# Patient Record
Sex: Male | Born: 1960
Health system: Southern US, Community
[De-identification: ages and names within clinical notes are randomized; demographics above are authoritative.]

## PROBLEM LIST (undated history)

## (undated) DIAGNOSIS — J9383 Other pneumothorax: Secondary | ICD-10-CM

## (undated) DIAGNOSIS — E785 Hyperlipidemia, unspecified: Secondary | ICD-10-CM

## (undated) DIAGNOSIS — M1990 Unspecified osteoarthritis, unspecified site: Secondary | ICD-10-CM

## (undated) HISTORY — DX: Hyperlipidemia, unspecified: E78.5

## (undated) HISTORY — DX: Unspecified osteoarthritis, unspecified site: M19.90

---

## 2007-12-10 ENCOUNTER — Emergency Department (HOSPITAL_COMMUNITY): Admission: EM | Admit: 2007-12-10 | Discharge: 2007-12-10 | Payer: Self-pay | Admitting: Emergency Medicine

## 2009-12-31 ENCOUNTER — Encounter: Admission: RE | Admit: 2009-12-31 | Discharge: 2009-12-31 | Payer: Self-pay | Admitting: Neurology

## 2010-01-16 ENCOUNTER — Encounter: Admission: RE | Admit: 2010-01-16 | Discharge: 2010-01-16 | Payer: Self-pay | Admitting: Neurology

## 2010-03-20 HISTORY — PX: TOTAL HIP ARTHROPLASTY: SHX124

## 2010-03-26 ENCOUNTER — Inpatient Hospital Stay (HOSPITAL_COMMUNITY): Admission: RE | Admit: 2010-03-26 | Discharge: 2010-03-28 | Payer: Self-pay | Admitting: Orthopedic Surgery

## 2011-01-06 LAB — URINALYSIS, ROUTINE W REFLEX MICROSCOPIC
Hgb urine dipstick: NEGATIVE
Specific Gravity, Urine: 1.015 (ref 1.005–1.030)
Urobilinogen, UA: 0.2 mg/dL (ref 0.0–1.0)

## 2011-01-06 LAB — BASIC METABOLIC PANEL
BUN: 5 mg/dL — ABNORMAL LOW (ref 6–23)
CO2: 23 mEq/L (ref 19–32)
Calcium: 8.6 mg/dL (ref 8.4–10.5)
Calcium: 8.7 mg/dL (ref 8.4–10.5)
Calcium: 9.3 mg/dL (ref 8.4–10.5)
Creatinine, Ser: 0.91 mg/dL (ref 0.4–1.5)
GFR calc Af Amer: >60 mL/min (ref >60)
GFR calc Af Amer: >60 mL/min (ref >60)
GFR calc non Af Amer: >60 mL/min (ref >60)
GFR calc non Af Amer: >60 mL/min (ref >60)
Glucose, Bld: 103 mg/dL — ABNORMAL HIGH (ref 70–99)
Glucose, Bld: 77 mg/dL (ref 70–99)
Potassium: 4 mEq/L (ref 3.5–5.1)
Potassium: 4 mEq/L (ref 3.5–5.1)
Sodium: 137 mEq/L (ref 135–145)
Sodium: 137 mEq/L (ref 135–145)

## 2011-01-06 LAB — CBC
Hemoglobin: 11.9 g/dL — ABNORMAL LOW (ref 13.0–17.0)
MCHC: 33.6 g/dL (ref 30.0–36.0)
MCHC: 34.3 g/dL (ref 30.0–36.0)
MCV: 93.9 fL (ref 78.0–100.0)
Platelets: 233 10*3/uL (ref 150–400)
RBC: 3.71 MIL/uL — ABNORMAL LOW (ref 4.22–5.81)
RBC: 3.85 MIL/uL — ABNORMAL LOW (ref 4.22–5.81)
RDW: 13.5 % (ref 11.5–15.5)
RDW: 13.6 % (ref 11.5–15.5)
RDW: 13.4 % (ref 11.5–15.5)

## 2011-01-06 LAB — TYPE AND SCREEN
ABO/RH(D): B POS
Antibody Screen: NEGATIVE

## 2011-01-06 LAB — ABO/RH: ABO/RH(D): B POS

## 2011-01-06 LAB — DIFFERENTIAL
Basophils Relative: 1 % (ref 0–1)
Eosinophils Absolute: 0.2 10*3/uL (ref 0.0–0.7)
Lymphocytes Relative: 37 % (ref 12–46)
Monocytes Absolute: 0.4 10*3/uL (ref 0.1–1.0)
Monocytes Relative: 11 % (ref 3–12)
Neutro Abs: 1.8 10*3/uL (ref 1.7–7.7)
Neutrophils Relative %: 46 % (ref 43–77)

## 2011-01-06 LAB — URINE MICROSCOPIC-ADD ON

## 2012-09-13 ENCOUNTER — Other Ambulatory Visit: Payer: Self-pay | Admitting: Family Medicine

## 2012-09-13 ENCOUNTER — Ambulatory Visit
Admission: RE | Admit: 2012-09-13 | Discharge: 2012-09-13 | Disposition: A | Discharge status: Home / Self Care | Payer: Medicare Other | Source: Ambulatory Visit | Attending: Family Medicine | Admitting: Family Medicine

## 2012-09-13 DIAGNOSIS — R05 Cough: Secondary | ICD-10-CM

## 2012-09-13 DIAGNOSIS — R0602 Shortness of breath: Secondary | ICD-10-CM

## 2013-02-09 ENCOUNTER — Ambulatory Visit: Payer: Medicare Other | Attending: Orthopedic Surgery | Admitting: Physical Therapy

## 2013-02-09 DIAGNOSIS — IMO0001 Reserved for inherently not codable concepts without codable children: Secondary | ICD-10-CM | POA: Insufficient documentation

## 2013-02-09 DIAGNOSIS — M545 Low back pain, unspecified: Secondary | ICD-10-CM | POA: Insufficient documentation

## 2013-02-15 ENCOUNTER — Ambulatory Visit: Payer: Medicare Other | Admitting: Physical Therapy

## 2013-02-17 ENCOUNTER — Ambulatory Visit: Payer: Medicare Other | Admitting: Physical Therapy

## 2013-02-22 ENCOUNTER — Ambulatory Visit: Payer: Medicare Other | Attending: Orthopedic Surgery | Admitting: Physical Therapy

## 2013-02-22 DIAGNOSIS — M545 Low back pain, unspecified: Secondary | ICD-10-CM | POA: Insufficient documentation

## 2013-02-22 DIAGNOSIS — IMO0001 Reserved for inherently not codable concepts without codable children: Secondary | ICD-10-CM | POA: Insufficient documentation

## 2013-02-28 ENCOUNTER — Encounter: Payer: Self-pay | Admitting: Gastroenterology

## 2013-03-01 ENCOUNTER — Encounter: Payer: Medicare Other | Admitting: Physical Therapy

## 2013-03-01 ENCOUNTER — Ambulatory Visit: Payer: Medicare Other | Admitting: Physical Therapy

## 2013-03-02 ENCOUNTER — Ambulatory Visit: Payer: Medicare Other | Admitting: Physical Therapy

## 2013-03-10 ENCOUNTER — Ambulatory Visit: Payer: Medicare Other | Admitting: Physical Therapy

## 2013-03-15 ENCOUNTER — Ambulatory Visit: Payer: Medicare Other | Admitting: Physical Therapy

## 2013-03-17 ENCOUNTER — Ambulatory Visit: Payer: Medicare Other | Admitting: Physical Therapy

## 2013-03-22 ENCOUNTER — Telehealth: Payer: Self-pay | Admitting: Gastroenterology

## 2013-03-22 ENCOUNTER — Ambulatory Visit (AMBULATORY_SURGERY_CENTER): Payer: Medicare Other | Admitting: *Deleted

## 2013-03-22 VITALS — Ht 69.0 in | Wt 200.2 lb

## 2013-03-22 DIAGNOSIS — Z1211 Encounter for screening for malignant neoplasm of colon: Secondary | ICD-10-CM

## 2013-03-22 MED ORDER — NA SULFATE-K SULFATE-MG SULF 17.5-3.13-1.6 GM/177ML PO SOLN: 1.0000 | Freq: Once | ORAL | Status: DC

## 2013-03-22 NOTE — Progress Notes (Signed)
Denies allergies to eggs or soy products. Denies complications with anesthesia or sedation. 

## 2013-03-22 NOTE — Telephone Encounter (Signed)
Called pt and spoke with him regarding what paperwork he needed faxed. Pt states he left a form on the 3rd floor that needs to be faxed to social security. I did not find any form and advised the pt to check back in the morning since no-one was available on the 3rd floor at this time. Pt understood and will call back in the morning.

## 2013-03-22 NOTE — Telephone Encounter (Signed)
Called pt to verify what he needed faxed, no answer. I was unable to leave a message , but will try again later.

## 2013-03-24 ENCOUNTER — Ambulatory Visit: Payer: Medicare Other | Attending: Orthopedic Surgery | Admitting: Physical Therapy

## 2013-03-24 DIAGNOSIS — M545 Low back pain, unspecified: Secondary | ICD-10-CM | POA: Insufficient documentation

## 2013-03-24 DIAGNOSIS — IMO0001 Reserved for inherently not codable concepts without codable children: Secondary | ICD-10-CM | POA: Insufficient documentation

## 2013-03-28 ENCOUNTER — Telehealth: Payer: Self-pay | Admitting: Gastroenterology

## 2013-03-28 NOTE — Telephone Encounter (Signed)
No answer. Will try pt later 

## 2013-03-28 NOTE — Telephone Encounter (Signed)
Told pt it would be better for him not to donate plasma 1 week before procedure and 1 week after procedure

## 2013-03-29 ENCOUNTER — Ambulatory Visit: Payer: Medicare Other | Admitting: Physical Therapy

## 2013-03-31 ENCOUNTER — Ambulatory Visit: Payer: Medicare Other | Admitting: Physical Therapy

## 2013-04-05 ENCOUNTER — Ambulatory Visit (AMBULATORY_SURGERY_CENTER): Payer: Medicare Other | Admitting: Gastroenterology

## 2013-04-05 ENCOUNTER — Encounter: Payer: Self-pay | Admitting: Gastroenterology

## 2013-04-05 VITALS — BP 164/100 | HR 62 | Temp 97.3°F | Resp 20 | Ht 69.0 in | Wt 200.0 lb

## 2013-04-05 DIAGNOSIS — D126 Benign neoplasm of colon, unspecified: Secondary | ICD-10-CM

## 2013-04-05 DIAGNOSIS — Z1211 Encounter for screening for malignant neoplasm of colon: Secondary | ICD-10-CM

## 2013-04-05 MED ORDER — SODIUM CHLORIDE 0.9 % IV SOLN: 500.0000 mL | INTRAVENOUS | Status: DC

## 2013-04-05 NOTE — Progress Notes (Signed)
Report to pacu rn, vss, bbs=clear 

## 2013-04-05 NOTE — Op Note (Signed)
[facility] [address] [place], [address]   COLONOSCOPY PROCEDURE REPORT  PATIENT: Jeremy Cunningham, Jeremy Cunningham  MR#: [id number] BIRTHDATE: Jul 18, 1961 , 52  yrs. old GENDER: Male ENDOSCOPIST: Louis Meckel, MD REFERRED XB:JYNWG Blount, M.D. PROCEDURE DATE:  04/05/2013 PROCEDURE:   Colonoscopy with snare polypectomy ASA CLASS:   Class II INDICATIONS:average risk screening. MEDICATIONS: MAC sedation, administered by CRNA and Propofol (Diprivan) 230 mg IV  DESCRIPTION OF PROCEDURE:   After the risks benefits and alternatives of the procedure were thoroughly explained, informed consent was obtained.  A digital rectal exam revealed no abnormalities of the rectum.   The LB [id number]  endoscope was introduced through the anus and advanced to the cecum, which was identified by both the appendix and ileocecal valve. No adverse events experienced.   The quality of the prep was excellent using Suprep  The instrument was then slowly withdrawn as the colon was fully examined.      COLON FINDINGS: Two sessile polyps measuring 3 mm in size were found in the sigmoid colon.  A polypectomy was performed with a cold snare.  The resection was complete and the polyp tissue was completely retrieved.   The colon mucosa was otherwise normal. Retroflexed views revealed no abnormalities. The time to cecum=1 minutes 47 seconds.  Withdrawal time=8 minutes 58 seconds.  The scope was withdrawn and the procedure completed. COMPLICATIONS: There were no complications.  ENDOSCOPIC IMPRESSION: 1.   Two sessile polyps measuring 3 mm in size were found in the sigmoid colon; polypectomy was performed with a cold snare 2.   The colon mucosa was otherwise normal  RECOMMENDATIONS: If the polyp(s) removed today are proven to be adenomatous (pre-cancerous) polyps, you will need a repeat colonoscopy in 5 years.  Otherwise you should continue to follow colorectal cancer screening guidelines for  "routine risk" patients with colonoscopy in 10 years.  You will receive a letter within 1-2 weeks with the results of your biopsy as well as final recommendations.  Please call my office if you have not received a letter after 3 weeks.   eSigned:  Louis Meckel, MD 04/05/2013 2:04 PM   cc:   PATIENT NAME:  Enio, Hornback MR#: [id number]

## 2013-04-05 NOTE — Progress Notes (Signed)
Patient did not experience any of the following events: a burn prior to discharge; a fall within the facility; wrong site/side/patient/procedure/implant event; or a hospital transfer or hospital admission upon discharge from the facility. (G8907) Patient did not have preoperative order for IV antibiotic SSI prophylaxis. (G8918)  

## 2013-04-05 NOTE — Patient Instructions (Addendum)
Discharge instructions given with verbal understanding. Handout on polyps given. Resume previous medications. YOU HAD AN ENDOSCOPIC PROCEDURE TODAY AT THE [facility]: Refer to the procedure report that was given to you for any specific questions about what was found during the examination.  If the procedure report does not answer your questions, please call your gastroenterologist to clarify.  If you requested that your care partner not be given the details of your procedure findings, then the procedure report has been included in a sealed envelope for you to review at your convenience later.  YOU SHOULD EXPECT: Some feelings of bloating in the abdomen. Passage of more gas than usual.  Walking can help get rid of the air that was put into your GI tract during the procedure and reduce the bloating. If you had a lower endoscopy (such as a colonoscopy or flexible sigmoidoscopy) you may notice spotting of blood in your stool or on the toilet paper. If you underwent a bowel prep for your procedure, then you may not have a normal bowel movement for a few days.  DIET: Your first meal following the procedure should be a light meal and then it is ok to progress to your normal diet.  A half-sandwich or bowl of soup is an example of a good first meal.  Heavy or fried foods are harder to digest and may make you feel nauseous or bloated.  Likewise meals heavy in dairy and vegetables can cause extra gas to form and this can also increase the bloating.  Drink plenty of fluids but you should avoid alcoholic beverages for 24 hours.  ACTIVITY: Your care partner should take you home directly after the procedure.  You should plan to take it easy, moving slowly for the rest of the day.  You can resume normal activity the day after the procedure however you should NOT DRIVE or use heavy machinery for 24 hours (because of the sedation medicines used during the test).    SYMPTOMS TO REPORT IMMEDIATELY: A  gastroenterologist can be reached at any hour.  During normal business hours, 8:30 AM to 5:00 PM Monday through Friday, call [phone].  After hours and on weekends, please call the GI answering service at [phone] who will take a message and have the physician on call contact you.   Following lower endoscopy (colonoscopy or flexible sigmoidoscopy):  Excessive amounts of blood in the stool  Significant tenderness or worsening of abdominal pains  Swelling of the abdomen that is new, acute  Fever of 100F or higher  FOLLOW UP: If any biopsies were taken you will be contacted by phone or by letter within the next 1-3 weeks.  Call your gastroenterologist if you have not heard about the biopsies in 3 weeks.  Our staff will call the home number listed on your records the next business day following your procedure to check on you and address any questions or concerns that you may have at that time regarding the information given to you following your procedure. This is a courtesy call and so if there is no answer at the home number and we have not heard from you through the emergency physician on call, we will assume that you have returned to your regular daily activities without incident.  SIGNATURES/CONFIDENTIALITY: You and/or your care partner have signed paperwork which will be entered into your electronic medical record.  These signatures attest to the fact that that the information above on your After Visit Summary has   been reviewed and is understood.  Full responsibility of the confidentiality of this discharge information lies with you and/or your care-partner. 

## 2013-04-05 NOTE — Progress Notes (Signed)
Called to room to assist during endoscopic procedure.  Patient ID and intended procedure confirmed with present staff. Received instructions for my participation in the procedure from the performing physician.  

## 2013-04-06 ENCOUNTER — Telehealth: Payer: Self-pay | Admitting: *Deleted

## 2013-04-06 NOTE — Telephone Encounter (Signed)
  Follow up Call-  Call back number 04/05/2013  Post procedure Call Back phone  # [phone]  Permission to leave phone message Yes     Patient questions:  Do you have a fever, pain , or abdominal swelling? no Pain Score  0 *  Have you tolerated food without any problems? yes  Have you been able to return to your normal activities? yes  Do you have any questions about your discharge instructions: Diet   no Medications  no Follow up visit  no  Do you have questions or concerns about your Care? no  Actions: * If pain score is 4 or above: No action needed, pain <4.

## 2013-04-07 ENCOUNTER — Ambulatory Visit: Payer: Medicare Other | Admitting: Physical Therapy

## 2013-04-11 ENCOUNTER — Ambulatory Visit: Payer: Medicare Other | Admitting: Physical Therapy

## 2013-04-14 ENCOUNTER — Ambulatory Visit: Payer: Medicare Other | Admitting: Physical Therapy

## 2013-04-25 ENCOUNTER — Encounter: Payer: Self-pay | Admitting: Gastroenterology

## 2013-04-26 ENCOUNTER — Telehealth: Payer: Self-pay | Admitting: Gastroenterology

## 2013-04-26 NOTE — Telephone Encounter (Signed)
Discussed result letter with pt and he is aware. Recall colon in 10 years, pt aware.

## 2016-04-29 DIAGNOSIS — M169 Osteoarthritis of hip, unspecified: Secondary | ICD-10-CM | POA: Diagnosis not present

## 2016-04-29 DIAGNOSIS — M25551 Pain in right hip: Secondary | ICD-10-CM | POA: Diagnosis not present

## 2016-04-29 DIAGNOSIS — E785 Hyperlipidemia, unspecified: Secondary | ICD-10-CM | POA: Diagnosis not present

## 2016-04-29 DIAGNOSIS — I10 Essential (primary) hypertension: Secondary | ICD-10-CM | POA: Diagnosis not present

## 2016-05-23 DIAGNOSIS — M25551 Pain in right hip: Secondary | ICD-10-CM | POA: Diagnosis not present

## 2016-05-23 DIAGNOSIS — M1611 Unilateral primary osteoarthritis, right hip: Secondary | ICD-10-CM | POA: Diagnosis not present

## 2016-05-23 DIAGNOSIS — S300XXD Contusion of lower back and pelvis, subsequent encounter: Secondary | ICD-10-CM | POA: Diagnosis not present

## 2016-05-23 DIAGNOSIS — Z471 Aftercare following joint replacement surgery: Secondary | ICD-10-CM | POA: Diagnosis not present

## 2016-06-19 DIAGNOSIS — M1611 Unilateral primary osteoarthritis, right hip: Secondary | ICD-10-CM | POA: Diagnosis not present

## 2016-09-02 DIAGNOSIS — R0982 Postnasal drip: Secondary | ICD-10-CM | POA: Diagnosis not present

## 2016-09-02 DIAGNOSIS — R0981 Nasal congestion: Secondary | ICD-10-CM | POA: Diagnosis not present

## 2016-09-02 DIAGNOSIS — J0100 Acute maxillary sinusitis, unspecified: Secondary | ICD-10-CM | POA: Diagnosis not present

## 2016-11-13 DIAGNOSIS — H40013 Open angle with borderline findings, low risk, bilateral: Secondary | ICD-10-CM | POA: Diagnosis not present

## 2016-11-13 DIAGNOSIS — H524 Presbyopia: Secondary | ICD-10-CM | POA: Diagnosis not present

## 2016-12-03 DIAGNOSIS — M169 Osteoarthritis of hip, unspecified: Secondary | ICD-10-CM | POA: Diagnosis not present

## 2016-12-03 DIAGNOSIS — I10 Essential (primary) hypertension: Secondary | ICD-10-CM | POA: Diagnosis not present

## 2016-12-03 DIAGNOSIS — E785 Hyperlipidemia, unspecified: Secondary | ICD-10-CM | POA: Diagnosis not present

## 2016-12-03 DIAGNOSIS — M25551 Pain in right hip: Secondary | ICD-10-CM | POA: Diagnosis not present

## 2016-12-31 DIAGNOSIS — I10 Essential (primary) hypertension: Secondary | ICD-10-CM | POA: Diagnosis not present

## 2016-12-31 DIAGNOSIS — Z0000 Encounter for general adult medical examination without abnormal findings: Secondary | ICD-10-CM | POA: Diagnosis not present

## 2016-12-31 DIAGNOSIS — N401 Enlarged prostate with lower urinary tract symptoms: Secondary | ICD-10-CM | POA: Diagnosis not present

## 2017-03-30 DIAGNOSIS — M1611 Unilateral primary osteoarthritis, right hip: Secondary | ICD-10-CM | POA: Diagnosis not present

## 2017-03-30 DIAGNOSIS — M25551 Pain in right hip: Secondary | ICD-10-CM | POA: Diagnosis not present

## 2017-04-01 ENCOUNTER — Other Ambulatory Visit: Payer: Self-pay

## 2017-04-01 DIAGNOSIS — I10 Essential (primary) hypertension: Secondary | ICD-10-CM | POA: Diagnosis not present

## 2017-04-01 DIAGNOSIS — M169 Osteoarthritis of hip, unspecified: Secondary | ICD-10-CM | POA: Diagnosis not present

## 2017-04-01 DIAGNOSIS — E785 Hyperlipidemia, unspecified: Secondary | ICD-10-CM | POA: Diagnosis not present

## 2017-04-01 DIAGNOSIS — M25551 Pain in right hip: Secondary | ICD-10-CM | POA: Diagnosis not present

## 2017-04-01 NOTE — Patient Outreach (Signed)
Jeremy Cunningham) Care Management  04/01/2017  Jeremy Cunningham 1961/04/04 [id number]  Medication Adherence call to [name] We call [name] because he is showing past due on his simvastatin 20 mg patient is no longer on medication he said he was switch to a different medication.(patient could not tell me what medication he was switch too)   [facility] Direct Dial [phone]  Fax [phone] [name]

## 2017-05-28 DIAGNOSIS — M25551 Pain in right hip: Secondary | ICD-10-CM | POA: Diagnosis not present

## 2017-07-22 DIAGNOSIS — L209 Atopic dermatitis, unspecified: Secondary | ICD-10-CM | POA: Diagnosis not present

## 2017-07-22 DIAGNOSIS — M1611 Unilateral primary osteoarthritis, right hip: Secondary | ICD-10-CM | POA: Diagnosis not present

## 2017-07-22 DIAGNOSIS — Z23 Encounter for immunization: Secondary | ICD-10-CM | POA: Diagnosis not present

## 2017-07-22 DIAGNOSIS — E785 Hyperlipidemia, unspecified: Secondary | ICD-10-CM | POA: Diagnosis not present

## 2017-07-22 DIAGNOSIS — I10 Essential (primary) hypertension: Secondary | ICD-10-CM | POA: Diagnosis not present

## 2017-11-03 DIAGNOSIS — Z0000 Encounter for general adult medical examination without abnormal findings: Secondary | ICD-10-CM | POA: Diagnosis not present

## 2017-11-03 DIAGNOSIS — R972 Elevated prostate specific antigen [PSA]: Secondary | ICD-10-CM | POA: Diagnosis not present

## 2017-11-10 DIAGNOSIS — I10 Essential (primary) hypertension: Secondary | ICD-10-CM | POA: Diagnosis not present

## 2017-11-10 DIAGNOSIS — E785 Hyperlipidemia, unspecified: Secondary | ICD-10-CM | POA: Diagnosis not present

## 2017-12-01 DIAGNOSIS — H40013 Open angle with borderline findings, low risk, bilateral: Secondary | ICD-10-CM | POA: Diagnosis not present

## 2017-12-01 DIAGNOSIS — H52202 Unspecified astigmatism, left eye: Secondary | ICD-10-CM | POA: Diagnosis not present

## 2017-12-01 DIAGNOSIS — H524 Presbyopia: Secondary | ICD-10-CM | POA: Diagnosis not present

## 2018-01-05 DIAGNOSIS — Z0000 Encounter for general adult medical examination without abnormal findings: Secondary | ICD-10-CM | POA: Diagnosis not present

## 2018-01-05 DIAGNOSIS — Z79899 Other long term (current) drug therapy: Secondary | ICD-10-CM | POA: Diagnosis not present

## 2018-01-05 DIAGNOSIS — I10 Essential (primary) hypertension: Secondary | ICD-10-CM | POA: Diagnosis not present

## 2018-01-05 DIAGNOSIS — E785 Hyperlipidemia, unspecified: Secondary | ICD-10-CM | POA: Diagnosis not present

## 2018-04-06 DIAGNOSIS — Z79899 Other long term (current) drug therapy: Secondary | ICD-10-CM | POA: Diagnosis not present

## 2018-04-06 DIAGNOSIS — G441 Vascular headache, not elsewhere classified: Secondary | ICD-10-CM | POA: Diagnosis not present

## 2018-04-06 DIAGNOSIS — I10 Essential (primary) hypertension: Secondary | ICD-10-CM | POA: Diagnosis not present

## 2018-04-06 DIAGNOSIS — M169 Osteoarthritis of hip, unspecified: Secondary | ICD-10-CM | POA: Diagnosis not present

## 2018-04-06 DIAGNOSIS — E785 Hyperlipidemia, unspecified: Secondary | ICD-10-CM | POA: Diagnosis not present

## 2018-06-02 DIAGNOSIS — M25551 Pain in right hip: Secondary | ICD-10-CM | POA: Diagnosis not present

## 2018-06-02 DIAGNOSIS — M1611 Unilateral primary osteoarthritis, right hip: Secondary | ICD-10-CM | POA: Diagnosis not present

## 2018-06-02 DIAGNOSIS — Z96642 Presence of left artificial hip joint: Secondary | ICD-10-CM | POA: Diagnosis not present

## 2018-07-07 DIAGNOSIS — M25551 Pain in right hip: Secondary | ICD-10-CM | POA: Diagnosis not present

## 2018-07-20 DIAGNOSIS — I10 Essential (primary) hypertension: Secondary | ICD-10-CM | POA: Diagnosis not present

## 2018-07-20 DIAGNOSIS — E785 Hyperlipidemia, unspecified: Secondary | ICD-10-CM | POA: Diagnosis not present

## 2018-07-20 DIAGNOSIS — J411 Mucopurulent chronic bronchitis: Secondary | ICD-10-CM | POA: Diagnosis not present

## 2018-07-20 DIAGNOSIS — Z23 Encounter for immunization: Secondary | ICD-10-CM | POA: Diagnosis not present

## 2018-11-02 DIAGNOSIS — M169 Osteoarthritis of hip, unspecified: Secondary | ICD-10-CM | POA: Diagnosis not present

## 2018-11-02 DIAGNOSIS — I10 Essential (primary) hypertension: Secondary | ICD-10-CM | POA: Diagnosis not present

## 2018-11-02 DIAGNOSIS — M25551 Pain in right hip: Secondary | ICD-10-CM | POA: Diagnosis not present

## 2018-11-02 DIAGNOSIS — E785 Hyperlipidemia, unspecified: Secondary | ICD-10-CM | POA: Diagnosis not present

## 2018-12-15 DIAGNOSIS — H2513 Age-related nuclear cataract, bilateral: Secondary | ICD-10-CM | POA: Diagnosis not present

## 2018-12-15 DIAGNOSIS — H40013 Open angle with borderline findings, low risk, bilateral: Secondary | ICD-10-CM | POA: Diagnosis not present

## 2018-12-15 DIAGNOSIS — H40003 Preglaucoma, unspecified, bilateral: Secondary | ICD-10-CM | POA: Diagnosis not present

## 2019-05-12 ENCOUNTER — Encounter (HOSPITAL_COMMUNITY): Payer: Self-pay | Admitting: Emergency Medicine

## 2019-05-12 ENCOUNTER — Emergency Department (HOSPITAL_COMMUNITY): Payer: Medicare Other

## 2019-05-12 ENCOUNTER — Other Ambulatory Visit: Payer: Self-pay

## 2019-05-12 ENCOUNTER — Inpatient Hospital Stay (HOSPITAL_COMMUNITY)
Admission: EM | Admit: 2019-05-12 | Discharge: 2019-05-20 | DRG: 200 | Disposition: A | Discharge status: Home / Self Care | Payer: Medicare Other | Attending: Internal Medicine | Admitting: Internal Medicine

## 2019-05-12 DIAGNOSIS — F17210 Nicotine dependence, cigarettes, uncomplicated: Secondary | ICD-10-CM | POA: Diagnosis present

## 2019-05-12 DIAGNOSIS — R079 Chest pain, unspecified: Secondary | ICD-10-CM | POA: Diagnosis not present

## 2019-05-12 DIAGNOSIS — E7800 Pure hypercholesterolemia, unspecified: Secondary | ICD-10-CM

## 2019-05-12 DIAGNOSIS — E876 Hypokalemia: Secondary | ICD-10-CM | POA: Diagnosis not present

## 2019-05-12 DIAGNOSIS — R0602 Shortness of breath: Secondary | ICD-10-CM

## 2019-05-12 DIAGNOSIS — Z716 Tobacco abuse counseling: Secondary | ICD-10-CM | POA: Diagnosis not present

## 2019-05-12 DIAGNOSIS — E785 Hyperlipidemia, unspecified: Secondary | ICD-10-CM | POA: Diagnosis not present

## 2019-05-12 DIAGNOSIS — Z96649 Presence of unspecified artificial hip joint: Secondary | ICD-10-CM | POA: Diagnosis not present

## 2019-05-12 DIAGNOSIS — J9311 Primary spontaneous pneumothorax: Secondary | ICD-10-CM

## 2019-05-12 DIAGNOSIS — F17200 Nicotine dependence, unspecified, uncomplicated: Secondary | ICD-10-CM | POA: Diagnosis not present

## 2019-05-12 DIAGNOSIS — R0789 Other chest pain: Secondary | ICD-10-CM | POA: Diagnosis not present

## 2019-05-12 DIAGNOSIS — I10 Essential (primary) hypertension: Secondary | ICD-10-CM

## 2019-05-12 DIAGNOSIS — Z4682 Encounter for fitting and adjustment of non-vascular catheter: Secondary | ICD-10-CM | POA: Diagnosis not present

## 2019-05-12 DIAGNOSIS — J90 Pleural effusion, not elsewhere classified: Secondary | ICD-10-CM | POA: Diagnosis not present

## 2019-05-12 DIAGNOSIS — R634 Abnormal weight loss: Secondary | ICD-10-CM | POA: Diagnosis present

## 2019-05-12 DIAGNOSIS — R9431 Abnormal electrocardiogram [ECG] [EKG]: Secondary | ICD-10-CM

## 2019-05-12 DIAGNOSIS — Z7289 Other problems related to lifestyle: Secondary | ICD-10-CM

## 2019-05-12 DIAGNOSIS — Z79899 Other long term (current) drug therapy: Secondary | ICD-10-CM

## 2019-05-12 DIAGNOSIS — J9811 Atelectasis: Secondary | ICD-10-CM | POA: Diagnosis not present

## 2019-05-12 DIAGNOSIS — J9382 Other air leak: Secondary | ICD-10-CM | POA: Diagnosis not present

## 2019-05-12 DIAGNOSIS — J9383 Other pneumothorax: Secondary | ICD-10-CM | POA: Diagnosis not present

## 2019-05-12 DIAGNOSIS — I119 Hypertensive heart disease without heart failure: Secondary | ICD-10-CM | POA: Diagnosis not present

## 2019-05-12 DIAGNOSIS — J939 Pneumothorax, unspecified: Secondary | ICD-10-CM | POA: Diagnosis present

## 2019-05-12 DIAGNOSIS — Z20828 Contact with and (suspected) exposure to other viral communicable diseases: Secondary | ICD-10-CM | POA: Diagnosis present

## 2019-05-12 DIAGNOSIS — R0603 Acute respiratory distress: Secondary | ICD-10-CM | POA: Diagnosis present

## 2019-05-12 LAB — BASIC METABOLIC PANEL WITH GFR
Anion gap: 14 (ref 5–15)
BUN: 9 mg/dL (ref 6–20)
CO2: 26 mmol/L (ref 22–32)
Calcium: 11.3 mg/dL — ABNORMAL HIGH (ref 8.9–10.3)
Chloride: 98 mmol/L (ref 98–111)
Creatinine, Ser: 1.07 mg/dL (ref 0.61–1.24)
GFR calc Af Amer: >60 mL/min
GFR calc non Af Amer: >60 mL/min
Glucose, Bld: 122 mg/dL — ABNORMAL HIGH (ref 70–99)
Potassium: 3.3 mmol/L — ABNORMAL LOW (ref 3.5–5.1)
Sodium: 138 mmol/L (ref 135–145)

## 2019-05-12 LAB — POTASSIUM: Potassium: 3.5 mmol/L (ref 3.5–5.1)

## 2019-05-12 LAB — CBC
HCT: 50.7 % (ref 39.0–52.0)
Hemoglobin: 16.5 g/dL (ref 13.0–17.0)
MCH: 30.6 pg (ref 26.0–34.0)
MCHC: 32.5 g/dL (ref 30.0–36.0)
MCV: 93.9 fL (ref 80.0–100.0)
Platelets: 329 K/uL (ref 150–400)
RBC: 5.4 MIL/uL (ref 4.22–5.81)
RDW: 12.9 % (ref 11.5–15.5)
WBC: 4.5 K/uL (ref 4.0–10.5)
nRBC: 0 % (ref 0.0–0.2)

## 2019-05-12 LAB — SARS CORONAVIRUS 2 BY RT PCR (HOSPITAL ORDER, PERFORMED IN ~~LOC~~ HOSPITAL LAB): SARS Coronavirus 2: NEGATIVE

## 2019-05-12 LAB — TROPONIN I (HIGH SENSITIVITY)
Troponin I (High Sensitivity): 25 ng/L — ABNORMAL HIGH
Troponin I (High Sensitivity): 25 ng/L — ABNORMAL HIGH

## 2019-05-12 LAB — PHOSPHORUS: Phosphorus: 3.1 mg/dL (ref 2.5–4.6)

## 2019-05-12 LAB — MAGNESIUM: Magnesium: 1.7 mg/dL (ref 1.7–2.4)

## 2019-05-12 LAB — D-DIMER, QUANTITATIVE: D-Dimer, Quant: 0.57 ug{FEU}/mL — ABNORMAL HIGH (ref 0.00–0.50)

## 2019-05-12 MED ORDER — OXYCODONE HCL 5 MG PO TABS
5.0000 mg | ORAL_TABLET | ORAL | Status: DC | PRN
  Administered 2019-05-12 – 2019-05-18 (×14): 5 mg via ORAL
  Filled 2019-05-12 (×14): qty 1

## 2019-05-12 MED ORDER — FUROSEMIDE 20 MG PO TABS
20.0000 mg | ORAL_TABLET | Freq: Every day | ORAL | Status: DC
  Administered 2019-05-13 – 2019-05-19 (×7): 20 mg via ORAL
  Filled 2019-05-12 (×7): qty 1

## 2019-05-12 MED ORDER — ONDANSETRON HCL 4 MG/2ML IJ SOLN: 4.0000 mg | Freq: Four times a day (QID) | INTRAMUSCULAR | Status: DC | PRN

## 2019-05-12 MED ORDER — NABUMETONE 500 MG PO TABS: 500.0000 mg | ORAL_TABLET | Freq: Every day | ORAL | Status: DC

## 2019-05-12 MED ORDER — FENOFIBRATE 160 MG PO TABS
160.0000 mg | ORAL_TABLET | Freq: Every day | ORAL | Status: DC
  Administered 2019-05-13 – 2019-05-20 (×8): 160 mg via ORAL
  Filled 2019-05-12 (×8): qty 1

## 2019-05-12 MED ORDER — SIMVASTATIN 20 MG PO TABS
20.0000 mg | ORAL_TABLET | Freq: Every evening | ORAL | Status: DC
  Administered 2019-05-13 – 2019-05-19 (×7): 20 mg via ORAL
  Filled 2019-05-12 (×8): qty 1

## 2019-05-12 MED ORDER — MORPHINE SULFATE (PF) 2 MG/ML IV SOLN
2.0000 mg | INTRAVENOUS | Status: DC | PRN
  Administered 2019-05-12 – 2019-05-19 (×6): 2 mg via INTRAVENOUS
  Filled 2019-05-12 (×6): qty 1

## 2019-05-12 MED ORDER — ACETAMINOPHEN 325 MG PO TABS
650.0000 mg | ORAL_TABLET | Freq: Four times a day (QID) | ORAL | Status: DC | PRN
  Administered 2019-05-13 – 2019-05-18 (×4): 650 mg via ORAL
  Filled 2019-05-12 (×4): qty 2

## 2019-05-12 MED ORDER — DOXYCYCLINE HYCLATE 100 MG PO CAPS: 100.0000 mg | ORAL_CAPSULE | Freq: Two times a day (BID) | ORAL | Status: DC

## 2019-05-12 MED ORDER — LIDOCAINE HCL (PF) 1 % IJ SOLN
INTRAMUSCULAR | Status: AC
  Filled 2019-05-12: qty 5

## 2019-05-12 MED ORDER — ACETAMINOPHEN 650 MG RE SUPP: 650.0000 mg | Freq: Four times a day (QID) | RECTAL | Status: DC | PRN

## 2019-05-12 MED ORDER — LIDOCAINE-EPINEPHRINE (PF) 2 %-1:200000 IJ SOLN
INTRAMUSCULAR | Status: AC
  Administered 2019-05-12: 12:00:00
  Filled 2019-05-12: qty 20

## 2019-05-12 MED ORDER — POTASSIUM CHLORIDE 20 MEQ/15ML (10%) PO SOLN
40.0000 meq | Freq: Once | ORAL | Status: AC
  Administered 2019-05-12: 40 meq via ORAL
  Filled 2019-05-12: qty 30

## 2019-05-12 MED ORDER — AMLODIPINE BESYLATE 5 MG PO TABS
5.0000 mg | ORAL_TABLET | Freq: Every day | ORAL | Status: DC
  Administered 2019-05-13 – 2019-05-20 (×8): 5 mg via ORAL
  Filled 2019-05-12 (×8): qty 1

## 2019-05-12 MED ORDER — MAGNESIUM SULFATE 2 GM/50ML IV SOLN
2.0000 g | Freq: Once | INTRAVENOUS | Status: AC
  Administered 2019-05-12: 2 g via INTRAVENOUS
  Filled 2019-05-12: qty 50

## 2019-05-12 MED ORDER — ONDANSETRON HCL 4 MG PO TABS: 4.0000 mg | ORAL_TABLET | Freq: Four times a day (QID) | ORAL | Status: DC | PRN

## 2019-05-12 MED ORDER — SODIUM CHLORIDE 0.9% FLUSH
3.0000 mL | Freq: Once | INTRAVENOUS | Status: AC
  Administered 2019-05-12: 3 mL via INTRAVENOUS

## 2019-05-12 MED ORDER — SENNOSIDES-DOCUSATE SODIUM 8.6-50 MG PO TABS: 1.0000 | ORAL_TABLET | Freq: Every evening | ORAL | Status: DC | PRN

## 2019-05-12 NOTE — ED Triage Notes (Signed)
Onset one day ago developed chest pain continued today. Seen at a urgent care sent to the ED for evaluation. Pain currently 7/10 pressure with shortness of breath.

## 2019-05-12 NOTE — Progress Notes (Signed)
Patient admitted to unit from ED, VSS with right pigtail chest tube in place. Chest tube hooked to -20cm suction as per orders. Patient settled in bed and oriented to unit and hospital routine. Pt resting comfortably in bed with call bell in reach and side rails up. Instructed patient to utilize call bell for assistance. Will continue to monitor closely for remainder of shift.

## 2019-05-12 NOTE — Progress Notes (Signed)
Right chest tube output for day shift is 6 ml of serous fluid.

## 2019-05-12 NOTE — Consult Note (Signed)
NAME:  Jeremy Cunningham, MRN:  [id number], DOB:  1961/08/26, LOS: 0 ADMISSION DATE:  05/12/2019, CONSULTATION DATE:  7/23 REFERRING MD:  Kathrynn Humble, CHIEF COMPLAINT:  pneumothorax   Brief History   58 year old smoker admitted on 7/23 with spontaneous pneumothorax  History of present illness   58 year old black male with a history of tobacco abuse, hypertension, and hyperlipidemia.  Stopped smoking approximately 4 days prior to presentation.  Was in usual state of health until 7/23 when he began to notice right-sided pleuritic chest discomfort primarily with deep breath.  He denied any prodrome of nasal congestion, sore throat, sick exposure, shortness of breath or cough.  He had not been lifting heavily or had any traumatic events.  He initially went to work on 7/22 thinking the discomfort was primarily gas related chest pain, the discomfort persisted over the course of the day and through the night and worsened on 7/23 with associated shortness of breath and because of this he presented to the emergency room.  On arrival to the ER chest x-ray was obtained this demonstrated complete right sided collapse from pneumothorax.  A small bore chest tube was inserted by the emergency room physician, pulmonary was consulted to assist with chest tube management.   Past Medical History  Hypertension, hyperlipidemia, tobacco abuse  Significant Hospital Events   7/23 pneumothorax, chest tube placed with expansion of lung post tube placement demonstrating 1 out of 7 airleak  Consults:  Pulmonary consulted 7/23  Procedures:  7/23 right-sided chest tube  Significant Diagnostic Tests:    Micro Data:    Antimicrobials:     Interim history/subjective:  Feels much better after chest tube placement  Objective   Blood pressure (Abnormal) 131/95, pulse (Abnormal) 102, temperature 98.2 F (36.8 C), temperature source Oral, resp. rate (Abnormal) 21, height 5\' 9"  (1.753 m), weight 85 kg, SpO2 96 %.       No  intake or output data in the 24 hours ending 05/12/19 1337 Filed Weights   05/12/19 1009  Weight: 85 kg    Examination: General: [name] sitting up in bed he is in no acute distress currently HENT: Normocephalic atraumatic no jugular venous distention Lungs: Clear to auscultation no accessory use.  Right chest tube dressing is clean dry and intact.  Chest tube assessment demonstrates good tidal with 1 out of 7 airleak still appreciated on 20 cm suction Cardiovascular: Regular rate and rhythm without murmur rub or gallop Abdomen: Soft not tender no organomegaly positive bowel sounds Extremities: Warm and dry brisk capillary refill no edema Neuro: Awake oriented no focal deficits GU: Due to void  Resolved Hospital Problem list     Assessment & Plan:  Right spontaneous pneumothorax in patient with history of tobacco abuse -Suspect underlying emphysema -Chest tube placed 7/23 Plan/recommendation Keep chest tube 20 cm suction Will initiate chest tube maintenance protocol Repeat chest x-ray in a.m. and assess for air leak to determine timing for transition to waterseal Will need CT of chest once no longer has airleak to look for emphysematous changes We will check alpha 1 antitrypsin level Will need pulmonary function testing as an outpatient    Best practice:  Diet: reg Pain/Anxiety/Delirium protocol (if indicated): na VAP protocol (if indicated): na DVT prophylaxis: pending GI prophylaxis: na Glucose control: na Mobility: oob w/ assist  Code Status: full code  Family Communication: pending  Disposition:  Needs admission Labs   CBC: Recent Labs  Lab 05/12/19 1017  WBC 4.5  HGB 16.5  HCT 50.7  MCV 93.9  PLT 121    Basic Metabolic Panel: Recent Labs  Lab 05/12/19 1017  NA 138  K 3.3*  CL 98  CO2 26  GLUCOSE 122*  BUN 9  CREATININE 1.07  CALCIUM 11.3*   GFR: Estimated Creatinine Clearance: 83.3 mL/min (by C-G formula based on SCr  of 1.07 mg/dL). Recent Labs  Lab 05/12/19 1017  WBC 4.5    Liver Function Tests: No results for input(s): AST, ALT, ALKPHOS, BILITOT, PROT, ALBUMIN in the last 168 hours. No results for input(s): LIPASE, AMYLASE in the last 168 hours. No results for input(s): AMMONIA in the last 168 hours.  ABG No results found for: PHART, PCO2ART, PO2ART, HCO3, TCO2, ACIDBASEDEF, O2SAT   Coagulation Profile: No results for input(s): INR, PROTIME in the last 168 hours.  Cardiac Enzymes: No results for input(s): CKTOTAL, CKMB, CKMBINDEX, TROPONINI in the last 168 hours.  HbA1C: No results found for: HGBA1C  CBG: No results for input(s): GLUCAP in the last 168 hours.  Review of Systems:   Review of Systems  Constitutional: Negative.   HENT: Negative.   Eyes: Negative.   Respiratory: Positive for shortness of breath. Negative for cough, hemoptysis and sputum production.   Cardiovascular: Positive for chest pain. Negative for leg swelling and PND.  Gastrointestinal: Negative.   Genitourinary: Negative.   Musculoskeletal: Negative.   Skin: Negative.   Neurological: Negative.   Endo/Heme/Allergies: Negative.   Psychiatric/Behavioral: Negative.      Past Medical History  He,  has a past medical history of Arthritis and Hyperlipidemia.   Surgical History    Past Surgical History:  Procedure Laterality Date  . TOTAL HIP ARTHROPLASTY  03/2010     Social History   reports that he has been smoking cigarettes. He has been smoking about 0.30 packs per day. He has never used smokeless tobacco. He reports current alcohol use of about 6.0 standard drinks of alcohol per week. He reports that he does not use drugs.   Family History   His family history is negative for Colon cancer, Esophageal cancer, Rectal cancer, and Stomach cancer.   Allergies No Known Allergies   Home Medications  Prior to Admission medications   Medication Sig Start Date End Date Taking? Authorizing Provider   doxycycline (VIBRAMYCIN) 100 MG capsule Take 100 mg by mouth 2 (two) times daily.    [provider]  nabumetone (RELAFEN) 500 MG tablet Take 500 mg by mouth daily.    [provider]  simvastatin (ZOCOR) 20 MG tablet Take 20 mg by mouth every evening.    [provider]     Critical care time: NA    Erick Colace ACNP-BC [facility] Pager # [phone] OR # [phone] if no answer

## 2019-05-12 NOTE — ED Notes (Signed)
Pig tail chest tube placed per dR  NANAVATI,  Sutured in place and taped Chest tube to low suction and  Potable cxray requested

## 2019-05-12 NOTE — ED Provider Notes (Addendum)
[place] EMERGENCY DEPARTMENT Provider Note   CSN: [id number] Arrival date & time: 05/12/19  1004    History   Chief Complaint Chief Complaint  Patient presents with  . Chest Pain    HPI Jeremy Cunningham is a 58 y.o. male with a PMH of arthritis and HLD presenting with intermittent non radiating central chest pain onset yesterday at 7am. Patient describes pain as a pressure and as a burning. Patient reports intermittent dry cough onset yesterday. Patient reports shortness of breath. Patient states symptoms are worse with laying down and exertion. Patient denies taking any medications for his symptoms. Patient states symptoms began as he was sitting on a recliner watching TV. Patient denies recent injury, fever, chills, nausea, vomiting, or diarrhea. Patient denies recent travel or sick exposures. Patient was evaluated at an urgent care today and was advised to come to the ER. Patient reports tobacco use, but denies alcohol or drug use. Patient denies family or personal history of cardiac problems.      HPI  Past Medical History:  Diagnosis Date  . Arthritis    back  . Hyperlipidemia     There are no active problems to display for this patient.   Past Surgical History:  Procedure Laterality Date  . TOTAL HIP ARTHROPLASTY  03/2010        Home Medications    Prior to Admission medications   Medication Sig Start Date End Date Taking? Authorizing Provider  doxycycline (VIBRAMYCIN) 100 MG capsule Take 100 mg by mouth 2 (two) times daily.    [provider]  nabumetone (RELAFEN) 500 MG tablet Take 500 mg by mouth daily.    [provider]  simvastatin (ZOCOR) 20 MG tablet Take 20 mg by mouth every evening.    [provider]    Family History Family History  Problem Relation Age of Onset  . Colon cancer Neg Hx   . Esophageal cancer Neg Hx   . Rectal cancer Neg Hx   . Stomach cancer Neg Hx     Social History Social History    Tobacco Use  . Smoking status: Current Every Day Smoker    Packs/day: 0.30    Types: Cigarettes  . Smokeless tobacco: Never Used  Substance Use Topics  . Alcohol use: Yes    Alcohol/week: 6.0 standard drinks    Types: 6 Cans of beer per week  . Drug use: No     Allergies   Patient has no known allergies.   Review of Systems Review of Systems  Constitutional: Negative for activity change, appetite change, chills, diaphoresis, fatigue, fever and unexpected weight change.  HENT: Negative for congestion and rhinorrhea.   Respiratory: Positive for cough and shortness of breath. Negative for chest tightness and wheezing.   Cardiovascular: Positive for chest pain. Negative for palpitations and leg swelling.  Gastrointestinal: Negative for abdominal pain, nausea and vomiting.  Endocrine: Negative for cold intolerance and heat intolerance.  Musculoskeletal: Negative for back pain.  Skin: Negative for rash.  Allergic/Immunologic: Negative for immunocompromised state.  Neurological: Negative for dizziness, syncope, weakness and light-headedness.  Psychiatric/Behavioral: Negative for agitation and behavioral problems. The patient is not nervous/anxious.     Physical Exam Updated Vital Signs BP (!) 131/95 (BP Location: Right Arm)   Pulse (!) 102   Temp 98.2 F (36.8 C) (Oral)   Resp (!) 21   Ht 5\' 9"  (1.753 m)   Wt 85 kg   SpO2 96%   BMI  27.67 kg/m   Physical Exam Vitals signs and nursing note reviewed.  Constitutional:      General: He is not in acute distress.    Appearance: He is well-developed. He is not diaphoretic.  HENT:     Head: Normocephalic and atraumatic.  Neck:     Musculoskeletal: Normal range of motion and neck supple.     Vascular: No JVD.  Cardiovascular:     Rate and Rhythm: Regular rhythm. Tachycardia present.     Pulses: Normal pulses.          Radial pulses are 2+ on the right side and 2+ on the left side.       Dorsalis pedis pulses are 2+ on the  right side and 2+ on the left side.     Heart sounds: Normal heart sounds. No murmur. No friction rub. No gallop.   Pulmonary:     Effort: Pulmonary effort is normal. No respiratory distress.     Breath sounds: Examination of the right-upper field reveals decreased breath sounds. Examination of the right-middle field reveals decreased breath sounds. Examination of the right-lower field reveals decreased breath sounds. Decreased breath sounds present. No wheezing, rhonchi or rales.  Chest:     Chest wall: No tenderness.  Abdominal:     Palpations: Abdomen is soft.     Tenderness: There is no abdominal tenderness. There is no guarding.  Musculoskeletal: Normal range of motion.     Right lower leg: He exhibits no tenderness. No edema.     Left lower leg: He exhibits no tenderness. No edema.  Skin:    Capillary Refill: Capillary refill takes less than 2 seconds.     Coloration: Skin is not pale.     Findings: No rash.  Neurological:     Mental Status: He is alert.    ED Treatments / Results  Labs (all labs ordered are listed, but only abnormal results are displayed) Labs Reviewed  BASIC METABOLIC PANEL - Abnormal; Notable for the following components:      Result Value   Potassium 3.3 (*)    Glucose, Bld 122 (*)    Calcium 11.3 (*)    All other components within normal limits  D-DIMER, QUANTITATIVE (NOT AT [facility]) - Abnormal; Notable for the following components:   D-Dimer, Quant 0.57 (*)    All other components within normal limits  TROPONIN I (HIGH SENSITIVITY) - Abnormal; Notable for the following components:   Troponin I (High Sensitivity) 25 (*)    All other components within normal limits  TROPONIN I (HIGH SENSITIVITY) - Abnormal; Notable for the following components:   Troponin I (High Sensitivity) 25 (*)    All other components within normal limits  SARS CORONAVIRUS 2 (HOSPITAL ORDER, [facility])  CBC  ALPHA-1-ANTITRYPSIN  MAGNESIUM    EKG  EKG Interpretation  Date/Time:  Thursday May 12 2019 10:12:07 EDT Ventricular Rate:  129 PR Interval:  154 QRS Duration: 86 QT Interval:  386 QTC Calculation: 565 R Axis:   83 Text Interpretation:  Sinus tachycardia Minimal voltage criteria for LVH, may be normal variant Borderline ECG Prolonged QT Confirmed by Varney Biles [phone]) on 05/12/2019 2:18:21 PM   Radiology Dg Chest 2 View  Result Date: 05/12/2019 CLINICAL DATA:  Chest pain, shortness of breath EXAM: CHEST - 2 VIEW COMPARISON:  09/13/2012 FINDINGS: Complete collapse of the right lung with large right pneumothorax. No evidence of tension. Heart is normal size. Possible nodule at the  base of the left lung. Otherwise left lung clear. No effusions. No acute bony abnormality. IMPRESSION: Complete collapse of the right lung with near large right pneumothorax. Question small nodule at the left lung base. This could be further evaluated with elective chest CT when patient is able. These results were called by telephone at the time of interpretation on 05/12/2019 at 10:52 am to Alyson Reedy, who verbally acknowledged these results. Electronically Signed   By: Rolm Baptise M.D.   On: 05/12/2019 10:55   Dg Chest Portable 1 View  Result Date: 05/12/2019 CLINICAL DATA:  Status post chest tube insertion. EXAM: PORTABLE CHEST 1 VIEW COMPARISON:  05/12/2019 at 10:30 a.m. FINDINGS: New right-sided chest tube lies along the lateral mid right hemithorax. There is near complete re-expansion of the right lung with only a minimal pneumothorax noted in the right upper hemithorax. Subcutaneous air is seen along the lateral right chest wall. There are linear opacities in the right mid to lower lung consistent with atelectasis. Lungs otherwise clear. No pleural effusion. Cardiac silhouette is normal in size.  No mediastinal hilar masses. IMPRESSION: 1. S/p right-sided chest tube placement near complete re-expansion of the right lung since the earlier study.  Electronically Signed   By: Lajean Manes M.D.   On: 05/12/2019 12:45    Procedures .Critical Care Performed by: Arville Lime, PA-C Authorized by: Arville Lime, PA-C   Critical care provider statement:    Critical care time (minutes):  44   Critical care was time spent personally by me on the following activities:  Discussions with consultants, evaluation of patient's response to treatment, examination of patient, ordering and performing treatments and interventions, ordering and review of laboratory studies, ordering and review of radiographic studies, pulse oximetry, re-evaluation of patient's condition, obtaining history from patient or surrogate and review of old charts   (including critical care time)  Medications Ordered in ED Medications  sodium chloride flush (NS) 0.9 % injection 3 mL (has no administration in time range)  lidocaine-EPINEPHrine (XYLOCAINE W/EPI) 2 %-1:200000 (PF) injection (  Given 05/12/19 1158)     Initial Impression / Assessment and Plan / ED Course  I have reviewed the triage vital signs and the nursing notes.  Pertinent labs & imaging results that were available during my care of the patient were reviewed by me and considered in my medical decision making (see chart for details).  Clinical Course as of May 11 1417  Thu May 12, 2019  1102 Complete collapse of the right lung with near large right pneumothorax.  Question small nodule at the left lung base. This could be further evaluated with elective chest CT when patient is able.    DG Chest 2 View [AH]  1251 1. S/p right-sided chest tube placement near complete re-expansion of the right lung since the earlier study.    DG Chest Portable 1 View [AH]    Clinical Course User Index [AH] Arville Lime, [place]      Patient presents with chest pain and shortness of breath. CXR reveals complete collapse of rthe right lung with near large right pneumothorax. Right sided pigtail thoracostomy  was performed by Dr. Kathrynn Humble. D dimer elevated at 0.57 and troponin elevated at 25. Suspect this is likely due to pneumothorax. COVID-19 test is negative. Patient will need admission for spontaneous pneumothorax and right sided pigtail thoracostomy insertion. Pulmonary critical care has agreed to follow up the chest tube. Salvadore Dom NP from [facility] has evaluated  chest tube while in the ER. Consulted unassigned medical team for admission. Hospitalist has agreed to admit patient.   Findings and plan of care discussed with supervising physician Dr. Kathrynn Humble who personally evaluated and examined this patient.  Final Clinical Impressions(s) / ED Diagnoses   Final diagnoses:  Spontaneous pneumothorax  Chest pain in adult  Shortness of breath    ED Discharge Orders    None       Arville Lime, PA-C 05/12/19 [address] [place], [place] 05/12/19 [place], Ankit, MD 05/15/19 1439

## 2019-05-12 NOTE — H&P (Addendum)
History and Physical    DOA: 05/12/2019  PCP: Elizabeth Palau, MD (Inactive)  Patient coming from: Home  Chief Complaint: Shortness of breath since yesterday  HPI: Jeremy Cunningham is a 58 y.o. male with history h/o hypertension, hyperlipidemia, DDD who smokes 1 pack/week (states he quit 4 days back) presents with complaints of worsening dyspnea since yesterday.  Patient states at his baseline he can walk more than 2 blocks, bicycle a good distance without any chest pain or shortness of breath.  Sometime yesterday evening he noticed he had trouble breathing which progressively got worse and he could not sleep all night.  He states he struggled to find a comfortable position to fall asleep last night.  Early morning he started experiencing retrosternal squeezing pain, started at 4/10 and worsened to 8/10 by this morning, radiating to the back, associated with dyspnea/palpitations but not associated with nausea/vomiting. Work-up in the ED revealed complete collapse of the right lung with large right pneumothorax requiring urgent thoracostomy performed by ED physician and placement of right-sided pigtail/smallbore chest tube.  EKG shows LVH pattern with nonspecific ST-T changes and prolonged QTC at 565 ms.  High-sensitivity troponin x2 sets stable at 25.  Patient denies any retrosternal chest discomfort currently but reports chest pain around the thoracostomy site.  Patient denies any history of trauma, forceful coughing, history of COPD/emphysema or prior personal or family history of spontaneous pneumothorax.  Patient seen by pulmonary who will follow along and manage chest tube but requested admission to hospitalist service.  Patient is currently saturating well at room air and chest tube placed to suction.  Review of Systems: As per HPI otherwise 10 point review of systems negative.    Past Medical History:  Diagnosis Date  . Arthritis    back  . Hyperlipidemia     Past Surgical History:   Procedure Laterality Date  . TOTAL HIP ARTHROPLASTY  03/2010    Social history:  reports that he has been smoking cigarettes. He has been smoking about 0.30 packs per day. He has never used smokeless tobacco. He reports current alcohol use of about 6.0 standard drinks of alcohol per week. He reports that he does not use drugs.   No Known Allergies  Family History  Problem Relation Age of Onset  . Colon cancer Neg Hx   . Esophageal cancer Neg Hx   . Rectal cancer Neg Hx   . Stomach cancer Neg Hx       Prior to Admission medications   Medication Sig Start Date End Date Taking? Authorizing Provider  amLODipine (NORVASC) 5 MG tablet Take 5 mg by mouth daily. 05/03/19  Yes [provider]  fenofibrate (TRICOR) 145 MG tablet Take 145 mg by mouth daily. 05/03/19  Yes [provider]  furosemide (LASIX) 20 MG tablet Take 20 mg by mouth daily. 05/03/19  Yes [provider]  Omega-3 Fatty Acids (FISH OIL PO) Take 1 tablet by mouth daily.   Yes [provider]    Physical Exam: Vitals:   05/12/19 1430 05/12/19 1445 05/12/19 1500 05/12/19 1548  BP: (!) 133/92   (!) 144/96  Pulse: 99 99 99 (!) 101  Resp: (!) 22 (!) 22 (!) 28 (!) 25  Temp:    98.4 F (36.9 C)  TempSrc:    Oral  SpO2: 97% 99% 100% 97%  Weight:      Height:        Constitutional: NAD, calm, comfortable Eyes: PERRL, lids and conjunctivae normal  ENMT: Mucous membranes are moist. Posterior pharynx clear of any exudate or lesions.Normal dentition.  Neck: normal, supple, no masses, no thyromegaly Respiratory: Bronchial breath sounds at right base, otherwise clear to auscultation bilaterally, no wheezing, no crackles. Normal respiratory effort.  Rt.Chest tube to suction.  Cardiovascular: Regular rate and rhythm, no murmurs / rubs / gallops. No extremity edema. 2+ pedal pulses. No carotid bruits.  Abdomen: no tenderness, no masses palpated. No hepatosplenomegaly. Bowel sounds positive.   Musculoskeletal: no clubbing / cyanosis. No joint deformity upper and lower extremities. Good ROM, no contractures. Normal muscle tone.  Neurologic: CN 2-12 grossly intact. Sensation intact, DTR normal. Strength 5/5 in all 4.  Psychiatric: Normal judgment and insight. Alert and oriented x 3. Normal mood.  SKIN/catheters: no rashes, lesions, ulcers. No induration  Labs on Admission: I have personally reviewed following labs and imaging studies  CBC: Recent Labs  Lab 05/12/19 1017  WBC 4.5  HGB 16.5  HCT 50.7  MCV 93.9  PLT 308   Basic Metabolic Panel: Recent Labs  Lab 05/12/19 1017 05/12/19 1430 05/12/19 1630  NA 138  --   --   K 3.3*  --   --   CL 98  --   --   CO2 26  --   --   GLUCOSE 122*  --   --   BUN 9  --   --   CREATININE 1.07  --   --   CALCIUM 11.3*  --   --   MG  --  1.7  --   PHOS  --   --  3.1   GFR: Estimated Creatinine Clearance: 83.3 mL/min (by C-G formula based on SCr of 1.07 mg/dL). Liver Function Tests: No results for input(s): AST, ALT, ALKPHOS, BILITOT, PROT, ALBUMIN in the last 168 hours. No results for input(s): LIPASE, AMYLASE in the last 168 hours. No results for input(s): AMMONIA in the last 168 hours. Coagulation Profile: No results for input(s): INR, PROTIME in the last 168 hours. Cardiac Enzymes: No results for input(s): CKTOTAL, CKMB, CKMBINDEX, TROPONINI in the last 168 hours. BNP (last 3 results) No results for input(s): PROBNP in the last 8760 hours. HbA1C: No results for input(s): HGBA1C in the last 72 hours. CBG: No results for input(s): GLUCAP in the last 168 hours. Lipid Profile: No results for input(s): CHOL, HDL, LDLCALC, TRIG, CHOLHDL, LDLDIRECT in the last 72 hours. Thyroid Function Tests: No results for input(s): TSH, T4TOTAL, FREET4, T3FREE, THYROIDAB in the last 72 hours. Anemia Panel: No results for input(s): VITAMINB12, FOLATE, FERRITIN, TIBC, IRON, RETICCTPCT in the last 72 hours. Urine analysis:    Component  Value Date/Time   COLORURINE YELLOW 03/14/2010 0820   APPEARANCEUR CLEAR 03/14/2010 0820   LABSPEC 1.015 03/14/2010 0820   PHURINE 5.0 03/14/2010 0820   GLUCOSEU NEGATIVE 03/14/2010 0820   HGBUR NEGATIVE 03/14/2010 0820   BILIRUBINUR NEGATIVE 03/14/2010 0820   KETONESUR NEGATIVE 03/14/2010 0820   PROTEINUR NEGATIVE 03/14/2010 0820   UROBILINOGEN 0.2 03/14/2010 0820   NITRITE NEGATIVE 03/14/2010 0820   LEUKOCYTESUR SMALL (A) 03/14/2010 0820    Radiological Exams on Admission: Personally reviewed  Dg Chest 2 View  Result Date: 05/12/2019 CLINICAL DATA:  Chest pain, shortness of breath EXAM: CHEST - 2 VIEW COMPARISON:  09/13/2012 FINDINGS: Complete collapse of the right lung with large right pneumothorax. No evidence of tension. Heart is normal size. Possible nodule at the base of the left lung. Otherwise left lung clear. No effusions. No acute  bony abnormality. IMPRESSION: Complete collapse of the right lung with near large right pneumothorax. Question small nodule at the left lung base. This could be further evaluated with elective chest CT when patient is able. These results were called by telephone at the time of interpretation on 05/12/2019 at 10:52 am to Alyson Reedy, who verbally acknowledged these results. Electronically Signed   By: Rolm Baptise M.D.   On: 05/12/2019 10:55   Dg Chest Portable 1 View  Result Date: 05/12/2019 CLINICAL DATA:  Status post chest tube insertion. EXAM: PORTABLE CHEST 1 VIEW COMPARISON:  05/12/2019 at 10:30 a.m. FINDINGS: New right-sided chest tube lies along the lateral mid right hemithorax. There is near complete re-expansion of the right lung with only a minimal pneumothorax noted in the right upper hemithorax. Subcutaneous air is seen along the lateral right chest wall. There are linear opacities in the right mid to lower lung consistent with atelectasis. Lungs otherwise clear. No pleural effusion. Cardiac silhouette is normal in size.  No mediastinal hilar  masses. IMPRESSION: 1. S/p right-sided chest tube placement near complete re-expansion of the right lung since the earlier study. Electronically Signed   By: Lajean Manes M.D.   On: 05/12/2019 12:45    EKG: Independently reviewed.  Sinus tachycardia, LVH pattern with nonspecific ST-T changes and prolonged QTC at 565 ms     Assessment and Plan:   Principal Problem:   Pneumothorax Active Problems:   Hypercholesteremia   HTN (hypertension)   Hypokalemia   Atypical chest pain    1.  Right-sided spontaneous pneumothorax: Unclear etiology.  No prior personal or family history.  Alpha-1 antitrypsin level sent off by PCCM and they will follow along for chest tube management while patient admitted.Patient has history of smoking for 20 years but denies being diagnosed with COPD or emphysema.  Will benefit from CT chest/PFTs at some point.  Defer the need for further pulmonary work-up/bronchoscopy to PCCM.  Neb treatments PRN ordered  2.  Hypertension: Resume home medications.  Patient states he takes Lasix for hypertension but denies any history of CHF/leg swellings.  Will obtain echo as his EKG does show severe LVH pattern.  3.  Prolonged QTC: Avoid QT prolonging agents.  Correct potassium level.  Will give IV magnesium and check mag/phos level.  Repeat EKG.  Echo as discussed above.  4.  Hyperlipidemia: Resume home medications  5.  Atypical chest pain: In the setting of problem #1.  Now retrosternal chest discomfort resolved and has pain around chest tube site.  Will have pain medications available.  Troponin x2 unremarkable.   DVT prophylaxis: SCDs  COVID screen: Negative  Code Status: Full code.Health care proxy would be his fiance Debbie  Patient/Family Communication: Discussed with patient and all questions answered to satisfaction.  Consults called: PCCM Admission status : I certify that at the point of admission it is my clinical judgment that the patient will require inpatient  hospital care spanning beyond 2 midnights from the point of admission due to high intensity of service and high frequency of surveillance required.Inpatient status is judged to be reasonable and necessary in order to provide the required intensity of service to ensure the patient's safety. The patient's presenting symptoms, physical exam findings, and initial radiographic and laboratory data in the context of their chronic comorbidities is felt to place them at high risk for further clinical deterioration. The following factors support the patient status of inpatient : Acute respiratory distress secondary to large right pneumothorax/lung collapse requiring  chest tube placement. Expected LOS: 2 to 3 days    Guilford Shi MD [facility] Pager [phone]  If 7PM-7AM, please contact night-coverage [email or web address] Password [facility]  05/12/2019, 5:48 PM

## 2019-05-12 NOTE — ED Provider Notes (Addendum)
Physical Exam  BP 115/84 (BP Location: Right Arm)   Pulse 89   Temp 98.7 F (37.1 C) (Oral)   Resp 20   Ht 5\' 9"  (1.753 m)   Wt 85 kg   SpO2 98%   BMI 27.67 kg/m   Physical Exam  ED Course/Procedures   Clinical Course as of May 14 1520  Thu May 12, 2019  1102 Complete collapse of the right lung with near large right pneumothorax.  Question small nodule at the left lung base. This could be further evaluated with elective chest CT when patient is able.    DG Chest 2 View [AH]  1251 1. S/p right-sided chest tube placement near complete re-expansion of the right lung since the earlier study.    DG Chest Portable 1 View [AH]    Clinical Course User Index [AH] Arville Lime, [place]    CHEST TUBE INSERTION  Date/Time: 05/12/2019 12:43 PM Performed by: Varney Biles, MD Authorized by: Varney Biles, MD   Consent:    Consent obtained:  Written   Consent given by:  Patient   Risks discussed:  Bleeding, incomplete drainage, nerve damage, pain, infection and damage to surrounding structures Universal protocol:    Procedure explained and questions answered to patient or proxy's satisfaction: yes     Relevant documents present and verified: yes     Test results available and properly labeled: yes     Imaging studies available: yes     Required blood products, implants, devices, and special equipment available: yes     Site/side marked: yes     Immediately prior to procedure a time out was called: yes     Patient identity confirmed:  Arm band Pre-procedure details:    Skin preparation:  ChloraPrep   Preparation: Patient was prepped and draped in the usual sterile fashion   Anesthesia (see MAR for exact dosages):    Anesthesia method:  Local infiltration   Local anesthetic:  Lidocaine 2% WITH epi Procedure details:    Placement location:  R anterior   Scalpel size:  10   Tube size (Fr):  Minicatheter   Ultrasound guidance: no     Tension pneumothorax: no    Tube connected to:  Heimlich valve and suction   Suture material:  2-0 silk   Dressing:  Petrolatum-impregnated gauze and 4x4 sterile gauze Post-procedure details:    Post-insertion x-ray findings: tube in good position     Patient tolerance of procedure:  Tolerated well, no immediate complications .Critical Care Performed by: Varney Biles, MD Authorized by: Varney Biles, MD   Critical care provider statement:    Critical care time (minutes):  32   Critical care was necessary to treat or prevent imminent or life-threatening deterioration of the following conditions:  Respiratory failure   Critical care was time spent personally by me on the following activities:  Discussions with consultants, evaluation of patient's response to treatment, examination of patient, ordering and performing treatments and interventions, ordering and review of laboratory studies, ordering and review of radiographic studies, pulse oximetry, re-evaluation of patient's condition, obtaining history from patient or surrogate and review of old charts    MDM   Patient with extensive smoking history but no underlying known lung disease comes in a chief complaint of right-sided chest pain.  He is also having some shortness of breath.  He was at work, and came to the ER because he was feeling worse.  Chest x-ray shows right-sided collapsed lung.  He  was tachycardic and tachypneic, without any hypoxia.  Without trauma and known history of COPD-emphysema, we have proceeded with right-sided pigtail thoracostomy.  Patient tolerated the procedure well.  He will be admitted to medicine service with pulmonary critical care team committing to follow-up the chest tube.    Varney Biles, MD 05/12/19 [place], Eligio Angert, MD 05/15/19 [phone]

## 2019-05-13 ENCOUNTER — Inpatient Hospital Stay (HOSPITAL_COMMUNITY): Payer: Medicare Other

## 2019-05-13 DIAGNOSIS — R9431 Abnormal electrocardiogram [ECG] [EKG]: Secondary | ICD-10-CM

## 2019-05-13 LAB — CBC
HCT: 42.8 % (ref 39.0–52.0)
Hemoglobin: 14.1 g/dL (ref 13.0–17.0)
MCH: 30.9 pg (ref 26.0–34.0)
MCHC: 32.9 g/dL (ref 30.0–36.0)
MCV: 93.9 fL (ref 80.0–100.0)
Platelets: 270 10*3/uL (ref 150–400)
RBC: 4.56 MIL/uL (ref 4.22–5.81)
RDW: 12.9 % (ref 11.5–15.5)
WBC: 4.7 10*3/uL (ref 4.0–10.5)
nRBC: 0 % (ref 0.0–0.2)

## 2019-05-13 LAB — ECHOCARDIOGRAM COMPLETE
Height: 69 in
Weight: 2998.26 oz

## 2019-05-13 LAB — BASIC METABOLIC PANEL
Anion gap: 9 (ref 5–15)
BUN: 11 mg/dL (ref 6–20)
CO2: 25 mmol/L (ref 22–32)
Calcium: 9.2 mg/dL (ref 8.9–10.3)
Chloride: 102 mmol/L (ref 98–111)
Creatinine, Ser: 0.97 mg/dL (ref 0.61–1.24)
GFR calc Af Amer: >60 mL/min (ref >60)
GFR calc non Af Amer: >60 mL/min (ref >60)
Glucose, Bld: 117 mg/dL — ABNORMAL HIGH (ref 70–99)
Potassium: 3.4 mmol/L — ABNORMAL LOW (ref 3.5–5.1)
Sodium: 136 mmol/L (ref 135–145)

## 2019-05-13 LAB — HIV ANTIBODY (ROUTINE TESTING W REFLEX): HIV Screen 4th Generation wRfx: NONREACTIVE

## 2019-05-13 LAB — ALPHA-1-ANTITRYPSIN: A-1 Antitrypsin, Ser: 102 mg/dL (ref 101–187)

## 2019-05-13 MED ORDER — KETOROLAC TROMETHAMINE 15 MG/ML IJ SOLN
15.0000 mg | Freq: Once | INTRAMUSCULAR | Status: AC
  Administered 2019-05-13: 11:00:00 15 mg via INTRAVENOUS
  Filled 2019-05-13: qty 1

## 2019-05-13 MED ORDER — POTASSIUM CHLORIDE CRYS ER 20 MEQ PO TBCR
40.0000 meq | EXTENDED_RELEASE_TABLET | Freq: Two times a day (BID) | ORAL | Status: AC
  Administered 2019-05-13 (×2): 40 meq via ORAL
  Filled 2019-05-13 (×2): qty 2

## 2019-05-13 NOTE — Consult Note (Signed)
NAME:  Shawndell Schillaci, MRN:  [id number], DOB:  Sep 19, 1961, LOS: 1 ADMISSION DATE:  05/12/2019, CONSULTATION DATE:  7/23 REFERRING MD:  Kathrynn Humble, CHIEF COMPLAINT:  pneumothorax   Brief History   58 year old smoker admitted on 7/23 with spontaneous pneumothorax  History of present illness   58 year old black male with a history of tobacco abuse, hypertension, and hyperlipidemia.  Stopped smoking approximately 4 days prior to presentation.  Was in usual state of health until 7/23 when he began to notice right-sided pleuritic chest discomfort primarily with deep breath.  He denied any prodrome of nasal congestion, sore throat, sick exposure, shortness of breath or cough.  He had not been lifting heavily or had any traumatic events.  He initially went to work on 7/22 thinking the discomfort was primarily gas related chest pain, the discomfort persisted over the course of the day and through the night and worsened on 7/23 with associated shortness of breath and because of this he presented to the emergency room.  On arrival to the ER chest x-ray was obtained this demonstrated complete right sided collapse from pneumothorax.  A small bore chest tube was inserted by the emergency room physician, pulmonary was consulted to assist with chest tube management.   Past Medical History  Hypertension, hyperlipidemia, tobacco abuse  Significant Hospital Events   7/23 pneumothorax, chest tube placed with expansion of lung post tube placement demonstrating 1 out of 7 airleak  Consults:  Pulmonary consulted 7/23  Procedures:  7/23 right-sided chest tube  Significant Diagnostic Tests:  7/24: CXR small residual right apical pneumothorax   Micro Data:  COVID 19 negative  Antimicrobials:   NA  Interim history/subjective:  C/O intermittent "grabbing" chest discomfort   Objective   Blood pressure (!) 123/94, pulse 90, temperature 98.2 F (36.8 C), temperature source Oral, resp. rate 18, height 5\' 9"  (1.753  m), weight 85 kg, SpO2 97 %.        Intake/Output Summary (Last 24 hours) at 05/13/2019 1013 Last data filed at 05/13/2019 5784 Gross per 24 hour  Intake 420 ml  Output 700 ml  Net -280 ml   Filed Weights   05/12/19 1009  Weight: 85 kg    Examination: General: Well developed, well nourished, preparing for bedside bath  HENT: Normocephalic. PERRL. MMM Lungs: BBS clear, FNL, symmetrical.  Right chest tube dressing is CDI. Chest tube assessment demonstrates good tidal with 1/7 airleak on 20 cm suction Cardiovascular: RRR. S1S2.  Abdomen: Flat.  Extremities: MAE well. Adematous.  Neuro: A&Ox4. CN II-XII grossly in tact   Resolved Hospital Problem list     Assessment & Plan:  Right spontaneous pneumothorax in patient with history of tobacco abuse with pleuritic CP -Suspect underlying emphysema -Chest tube placed 7/23 -Alpha 1 antitrypsin 102 Plan/recommendation Continue chest tube 20 cm suction for now  Continue chest tube maintenance protocol Repeat chest x-ray in a.m. and assess for air leak to determine timing for transition to waterseal Will need CT of chest once no longer has airleak to assess for emphysematous changes PFT as an outpatient One time dose ketorolac 15mg  IV    Best practice:  Diet: regular Pain/Anxiety/Delirium protocol (if indicated): na VAP protocol (if indicated): na DVT prophylaxis: per primary  GI prophylaxis: na Glucose control: na Mobility: oob w/ assist  Code Status: full code  Family Communication: updated by pt  Disposition: continue 6N.  Labs   CBC: Recent Labs  Lab 05/12/19 1017 05/13/19 0409  WBC 4.5 4.7  HGB 16.5  14.1  HCT 50.7 42.8  MCV 93.9 93.9  PLT 329 938    Basic Metabolic Panel: Recent Labs  Lab 05/12/19 1017 05/12/19 1430 05/12/19 1630 05/12/19 1818 05/13/19 0409  NA 138  --   --   --  136  K 3.3*  --   --  3.5 3.4*  CL 98  --   --   --  102  CO2 26  --   --   --  25  GLUCOSE 122*  --   --   --  117*   BUN 9  --   --   --  11  CREATININE 1.07  --   --   --  0.97  CALCIUM 11.3*  --   --   --  9.2  MG  --  1.7  --   --   --   PHOS  --   --  3.1  --   --       Review of Systems:   Review of Systems  Constitutional: Negative.   HENT: Negative.   Eyes: Negative.   Respiratory: Negative for cough, hemoptysis and sputum production.   Cardiovascular: Positive for chest pain. Negative for leg swelling and PND.       Intermittent "grabbing" chest discomfort   Gastrointestinal: Negative.   Genitourinary: Negative.   Musculoskeletal: Negative.   Skin: Negative.   Neurological: Negative.   Endo/Heme/Allergies: Negative.   Psychiatric/Behavioral: Negative.       Francine Graven, MSN, AGACNP  Pager [phone] or if no answer [phone] [facility] Pulmonary & Critical Care

## 2019-05-13 NOTE — Progress Notes (Signed)
2D Echocardiogram has been performed.  Jeremy Cunningham 05/13/2019, 9:35 AM

## 2019-05-13 NOTE — Progress Notes (Signed)
PROGRESS NOTE    Jeremy Cunningham  NID:[id number] DOB: 21-Jul-1961 DOA: 05/12/2019 PCP: Elizabeth Palau, MD (Inactive)   Brief Narrative:  Per HPI:  Jeremy Cunningham is a 58 y.o. male with history h/o hypertension, hyperlipidemia, DDD who smokes 1 pack/week (states he quit 4 days back) presents with complaints of worsening dyspnea since yesterday.  Patient states at his baseline he can walk more than 2 blocks, bicycle a good distance without any chest pain or shortness of breath.  Sometime yesterday evening he noticed he had trouble breathing which progressively got worse and he could not sleep all night.  He states he struggled to find a comfortable position to fall asleep last night.  Early morning he started experiencing retrosternal squeezing pain, started at 4/10 and worsened to 8/10 by this morning, radiating to the back, associated with dyspnea/palpitations but not associated with nausea/vomiting. Work-up in the ED revealed complete collapse of the right lung with large right pneumothorax requiring urgent thoracostomy performed by ED physician and placement of right-sided pigtail/smallbore chest tube.  EKG shows LVH pattern with nonspecific ST-T changes and prolonged QTC at 565 ms.  High-sensitivity troponin x2 sets stable at 25.  Patient denies any retrosternal chest discomfort currently but reports chest pain around the thoracostomy site.  Patient denies any history of trauma, forceful coughing, history of COPD/emphysema or prior personal or family history of spontaneous pneumothorax.  Patient seen by pulmonary who will follow along and manage chest tube but requested admission to hospitalist service.  Patient is currently saturating well at room air and chest tube placed to suction.  Patient was admitted for right-sided spontaneous pneumothorax and is status post chest tube placement.  He has had no acute complaints or concerns noted overnight and remains on room air.  He has minimal chest pain  to the right side noted.  Assessment & Plan:   Principal Problem:   Pneumothorax Active Problems:   Hypercholesteremia   HTN (hypertension)   Hypokalemia   Atypical chest pain   Right-sided spontaneous pneumothorax of unclear etiology -Alpha-1 antitrypsin level sent and pending by PCCM -Appreciate further PCCM management of chest tube with likely CT chest to be obtained -Continue duo nebs as needed for shortness of breath or wheezing  Mild hypokalemia -Continue to replete orally today and recheck labs in a.m. -Recheck magnesium in a.m.  Hypertension-stable -Continue home medications -2D echocardiogram pending for severe LVH pattern noted on EKG  Prolonged QTC -Continue to replete potassium -Patient was given IV magnesium on admission -Continue to monitor on telemetry and recheck electrolyte levels in a.m.  Hyperlipidemia -Continue home medications   DVT prophylaxis: SCDs Code Status: Full Family Communication: None at bedside Disposition Plan: Per PCCM for chest tube management.  Recheck electrolyte levels in a.m.   Consultants:   PCCM  Procedures:   Chest tube placement 7/23  Antimicrobials:   None   Subjective: Patient seen and evaluated today with no new acute complaints or concerns. No acute concerns or events noted overnight.  He continues to have some minimal pain to the right side where the chest tube is present.  He denies any respiratory distress or shortness of breath and remains on room air.  Objective: Vitals:   05/12/19 1500 05/12/19 1548 05/12/19 2055 05/13/19 0412  BP:  (!) 144/96 124/90 (!) 123/94  Pulse: 99 (!) 101 93 90  Resp: (!) 28 (!) 25 18 18   Temp:  98.4 F (36.9 C) 98.6 F (37 C) 98.2 F (36.8 C)  TempSrc:  Oral Oral Oral  SpO2: 100% 97% 97% 97%  Weight:      Height:        Intake/Output Summary (Last 24 hours) at 05/13/2019 0742 Last data filed at 05/12/2019 1519 Gross per 24 hour  Intake --  Output 250 ml  Net -250  ml   Filed Weights   05/12/19 1009  Weight: 85 kg    Examination:  General exam: Appears calm and comfortable  Respiratory system: Clear to auscultation. Respiratory effort normal.  Chest tube present to right side with dressings clean dry and intact.  Minimal drainage noted.  No wheezing.  Currently on room air. Cardiovascular system: S1 & S2 heard, RRR. No JVD, murmurs, rubs, gallops or clicks. No pedal edema. Gastrointestinal system: Abdomen is nondistended, soft and nontender. No organomegaly or masses felt. Normal bowel sounds heard. Central nervous system: Alert and oriented. No focal neurological deficits. Extremities: Symmetric 5 x 5 power. Skin: No rashes, lesions or ulcers Psychiatry: Judgement and insight appear normal. Mood & affect appropriate.     Data Reviewed: I have personally reviewed following labs and imaging studies  CBC: Recent Labs  Lab 05/12/19 1017 05/13/19 0409  WBC 4.5 4.7  HGB 16.5 14.1  HCT 50.7 42.8  MCV 93.9 93.9  PLT 329 660   Basic Metabolic Panel: Recent Labs  Lab 05/12/19 1017 05/12/19 1430 05/12/19 1630 05/12/19 1818 05/13/19 0409  NA 138  --   --   --  136  K 3.3*  --   --  3.5 3.4*  CL 98  --   --   --  102  CO2 26  --   --   --  25  GLUCOSE 122*  --   --   --  117*  BUN 9  --   --   --  11  CREATININE 1.07  --   --   --  0.97  CALCIUM 11.3*  --   --   --  9.2  MG  --  1.7  --   --   --   PHOS  --   --  3.1  --   --    GFR: Estimated Creatinine Clearance: 91.9 mL/min (by C-G formula based on SCr of 0.97 mg/dL). Liver Function Tests: No results for input(s): AST, ALT, ALKPHOS, BILITOT, PROT, ALBUMIN in the last 168 hours. No results for input(s): LIPASE, AMYLASE in the last 168 hours. No results for input(s): AMMONIA in the last 168 hours. Coagulation Profile: No results for input(s): INR, PROTIME in the last 168 hours. Cardiac Enzymes: No results for input(s): CKTOTAL, CKMB, CKMBINDEX, TROPONINI in the last 168  hours. BNP (last 3 results) No results for input(s): PROBNP in the last 8760 hours. HbA1C: No results for input(s): HGBA1C in the last 72 hours. CBG: No results for input(s): GLUCAP in the last 168 hours. Lipid Profile: No results for input(s): CHOL, HDL, LDLCALC, TRIG, CHOLHDL, LDLDIRECT in the last 72 hours. Thyroid Function Tests: No results for input(s): TSH, T4TOTAL, FREET4, T3FREE, THYROIDAB in the last 72 hours. Anemia Panel: No results for input(s): VITAMINB12, FOLATE, FERRITIN, TIBC, IRON, RETICCTPCT in the last 72 hours. Sepsis Labs: No results for input(s): PROCALCITON, LATICACIDVEN in the last 168 hours.  Recent Results (from the past 240 hour(s))  SARS Coronavirus 2 (CEPHEID - Performed in [place] hospital lab), Hosp Order     Status: None   Collection Time: 05/12/19 11:40 AM   Specimen: Nasopharyngeal Swab  Result Value Ref Range Status   SARS Coronavirus 2 NEGATIVE NEGATIVE Final    Comment: (NOTE) If result is NEGATIVE SARS-CoV-2 target nucleic acids are NOT DETECTED. The SARS-CoV-2 RNA is generally detectable in upper and lower  respiratory specimens during the acute phase of infection. The lowest  concentration of SARS-CoV-2 viral copies this assay can detect is 250  copies / mL. A negative result does not preclude SARS-CoV-2 infection  and should not be used as the sole basis for treatment or other  patient management decisions.  A negative result may occur with  improper specimen collection / handling, submission of specimen other  than nasopharyngeal swab, presence of viral mutation(s) within the  areas targeted by this assay, and inadequate number of viral copies  (<250 copies / mL). A negative result must be combined with clinical  observations, patient history, and epidemiological information. If result is POSITIVE SARS-CoV-2 target nucleic acids are DETECTED. The SARS-CoV-2 RNA is generally detectable in upper and lower  respiratory specimens  dur ing the acute phase of infection.  Positive  results are indicative of active infection with SARS-CoV-2.  Clinical  correlation with patient history and other diagnostic information is  necessary to determine patient infection status.  Positive results do  not rule out bacterial infection or co-infection with other viruses. If result is PRESUMPTIVE POSTIVE SARS-CoV-2 nucleic acids MAY BE PRESENT.   A presumptive positive result was obtained on the submitted specimen  and confirmed on repeat testing.  While 2019 novel coronavirus  (SARS-CoV-2) nucleic acids may be present in the submitted sample  additional confirmatory testing may be necessary for epidemiological  and / or clinical management purposes  to differentiate between  SARS-CoV-2 and other Sarbecovirus currently known to infect humans.  If clinically indicated additional testing with an alternate test  methodology [phone]) is advised. The SARS-CoV-2 RNA is generally  detectable in upper and lower respiratory sp ecimens during the acute  phase of infection. The expected result is Negative. Fact Sheet for Patients:  StrictlyIdeas.no Fact Sheet for Healthcare Providers: BankingDealers.co.za This test is not yet approved or cleared by the [place] FDA and has been authorized for detection and/or diagnosis of SARS-CoV-2 by FDA under an Emergency Use Authorization (EUA).  This EUA will remain in effect (meaning this test can be used) for the duration of the COVID-19 declaration under Section 564(b)(1) of the Act, 21 U.S.C. section 360bbb-3(b)(1), unless the authorization is terminated or revoked sooner. Performed at [facility], [place] [address] [place], [place] [address]          Radiology Studies: Dg Chest 2 View  Result Date: 05/12/2019 CLINICAL DATA:  Chest pain, shortness of breath EXAM: CHEST - 2 VIEW COMPARISON:  09/13/2012 FINDINGS: Complete collapse of  the right lung with large right pneumothorax. No evidence of tension. Heart is normal size. Possible nodule at the base of the left lung. Otherwise left lung clear. No effusions. No acute bony abnormality. IMPRESSION: Complete collapse of the right lung with near large right pneumothorax. Question small nodule at the left lung base. This could be further evaluated with elective chest CT when patient is able. These results were called by telephone at the time of interpretation on 05/12/2019 at 10:52 am to Alyson Reedy, who verbally acknowledged these results. Electronically Signed   By: Rolm Baptise M.D.   On: 05/12/2019 10:55   Dg Chest Portable 1 View  Result Date: 05/12/2019 CLINICAL DATA:  Status post chest tube  insertion. EXAM: PORTABLE CHEST 1 VIEW COMPARISON:  05/12/2019 at 10:30 a.m. FINDINGS: New right-sided chest tube lies along the lateral mid right hemithorax. There is near complete re-expansion of the right lung with only a minimal pneumothorax noted in the right upper hemithorax. Subcutaneous air is seen along the lateral right chest wall. There are linear opacities in the right mid to lower lung consistent with atelectasis. Lungs otherwise clear. No pleural effusion. Cardiac silhouette is normal in size.  No mediastinal hilar masses. IMPRESSION: 1. S/p right-sided chest tube placement near complete re-expansion of the right lung since the earlier study. Electronically Signed   By: Lajean Manes M.D.   On: 05/12/2019 12:45        Scheduled Meds:  amLODipine  5 mg Oral Daily   fenofibrate  160 mg Oral Daily   furosemide  20 mg Oral Daily   potassium chloride  40 mEq Oral BID   simvastatin  20 mg Oral QPM   Continuous Infusions:   LOS: 1 day    Time spent: 30 minutes    Stephany Poorman Darleen Crocker, DO [facility] Pager [phone]  If 7PM-7AM, please contact night-coverage [email or web address] Password [email or web address] 05/13/2019, 7:42 AM

## 2019-05-14 ENCOUNTER — Inpatient Hospital Stay (HOSPITAL_COMMUNITY): Payer: Medicare Other

## 2019-05-14 LAB — CBC
HCT: 40.9 % (ref 39.0–52.0)
Hemoglobin: 13.3 g/dL (ref 13.0–17.0)
MCH: 31.1 pg (ref 26.0–34.0)
MCHC: 32.5 g/dL (ref 30.0–36.0)
MCV: 95.6 fL (ref 80.0–100.0)
Platelets: 256 10*3/uL (ref 150–400)
RBC: 4.28 MIL/uL (ref 4.22–5.81)
RDW: 12.8 % (ref 11.5–15.5)
WBC: 2.9 10*3/uL — ABNORMAL LOW (ref 4.0–10.5)
nRBC: 0 % (ref 0.0–0.2)

## 2019-05-14 LAB — BASIC METABOLIC PANEL
Anion gap: 7 (ref 5–15)
BUN: 16 mg/dL (ref 6–20)
CO2: 25 mmol/L (ref 22–32)
Calcium: 9.1 mg/dL (ref 8.9–10.3)
Chloride: 104 mmol/L (ref 98–111)
Creatinine, Ser: 1.06 mg/dL (ref 0.61–1.24)
GFR calc Af Amer: >60 mL/min (ref >60)
GFR calc non Af Amer: >60 mL/min (ref >60)
Glucose, Bld: 112 mg/dL — ABNORMAL HIGH (ref 70–99)
Potassium: 4 mmol/L (ref 3.5–5.1)
Sodium: 136 mmol/L (ref 135–145)

## 2019-05-14 LAB — MAGNESIUM: Magnesium: 1.9 mg/dL (ref 1.7–2.4)

## 2019-05-14 MED ORDER — ENOXAPARIN SODIUM 40 MG/0.4ML ~~LOC~~ SOLN
40.0000 mg | SUBCUTANEOUS | Status: DC
  Administered 2019-05-14 – 2019-05-20 (×7): 40 mg via SUBCUTANEOUS
  Filled 2019-05-14 (×7): qty 0.4

## 2019-05-14 MED ORDER — POLYETHYLENE GLYCOL 3350 17 G PO PACK
17.0000 g | PACK | Freq: Every day | ORAL | Status: DC
  Administered 2019-05-14 – 2019-05-19 (×4): 17 g via ORAL
  Filled 2019-05-14 (×7): qty 1

## 2019-05-14 NOTE — Progress Notes (Signed)
PROGRESS NOTE [facility]   Jeremy Cunningham   EVO:[id number] DOB: June 21, 1961  DOA: 05/12/2019 PCP: Elizabeth Palau, MD (Inactive)   Brief Narrative:  Per HPI:  Jeremy Cunningham a 58 y.o.malewith history h/ohypertension, hyperlipidemia, DDD who smokes 1 pack/week (states he quit 4 days back) presents with complaints of worsening dyspnea since yesterday. Patient states at his baseline he can walk more than 2 blocks, bicycle a good distance without any chest pain or shortness of breath. Sometime yesterday evening he noticed he had trouble breathing which progressively got worse and he could not sleep all night. He states he struggled to find a comfortable position to fall asleep last night. Early morning he started experiencing retrosternal squeezing pain, started at 4/10 and worsened to 8/10 by this morning, radiating to the back, associated with dyspnea/palpitations but not associated with nausea/vomiting. Work-up in the ED revealed complete collapse of the right lung with large right pneumothorax requiring urgent thoracostomy performed by ED physician and placement of right-sided pigtail/smallbore chest tube. EKG shows LVH pattern with nonspecific ST-T changes and prolonged QTC at 565 ms.High-sensitivity troponin x2 sets stable at 25. Patient denies any retrosternal chest discomfort currently but reports chest pain around the thoracostomy site. Patient denies any history of trauma, forceful coughing, history of COPD/emphysema or prior personal or family history of spontaneous pneumothorax.  Patient seen by pulmonary who will follow along and manage chest tubebutrequested admission to hospitalist service. Patient is currently saturating well at room air and chest tube placed to suction.  Patient was admitted for right-sided spontaneous pneumothorax and is status post chest tube placement.    Subjective: Patient seen and examined, he has no complaints other than soreness/pain  at the site of the chest tube.  He denies shortness of breath, cough, dizziness, weakness and palpitations.  Tolerating diet well.  Assessment & Plan:   Principal Problem:   Pneumothorax Active Problems:   Hypercholesteremia   HTN (hypertension)   Hypokalemia   Atypical chest pain  Right side spontaneous pneumothorax Unclear etiology.  Alpha-1 antitrypsin pending.  PCCM managing chest tube.  Patient will need CT chest repeated in few days.  Continue duo nebs as needed for shortness of breath  Hypertension Stable, continue current medications. Echocardiogram shows 60 to 65% ejection fraction.  Mild hypokalemia Magnesium and potassium stable.  We will continue to monitor renal function.  Prolonged QTC Monitor EKG in the morning.  Telemetry reviewed.  Monitor electrolytes.  DVT prophylaxis: SCDs, Code Status: Full code Family Communication: Not at bedside Disposition Plan: Per CCM pending on chest tube removal.   Consultants:   PCCM  Procedures:   Chest tube placement 7/23  Antimicrobials: Anti-infectives (From admission, onward)   Start     Dose/Rate Route Frequency Ordered Stop   05/12/19 1515  doxycycline (VIBRAMYCIN) capsule 100 mg  Status:  Discontinued     100 mg Oral 2 times daily 05/12/19 1501 05/12/19 1514        Objective: Vitals:   05/13/19 0412 05/13/19 1452 05/13/19 2111 05/14/19 0449  BP: (!) 123/94 112/77 (!) 129/99 117/79  Pulse: 90 94 97 81  Resp: 18 18 18 18   Temp: 98.2 F (36.8 C) 98.7 F (37.1 C) 98.4 F (36.9 C) 98.2 F (36.8 C)  TempSrc: Oral Oral Oral Oral  SpO2: 97% 98% 98% 99%  Weight:      Height:        Intake/Output Summary (Last 24 hours) at 05/14/2019 1028 Last data filed at 05/14/2019 0900 Gross  per 24 hour  Intake 1140 ml  Output 821 ml  Net 319 ml   Filed Weights   05/12/19 1009  Weight: 85 kg    Examination:  General exam: Appears calm and comfortable  HEENT: AC/AT, PERRLA, OP moist and clear Respiratory  system: Clear to auscultation. No wheezes,crackle or rhonchi.  Right chest tube with small air leak. Cardiovascular system: S1 & S2 heard, RRR. No JVD, murmurs, rubs or gallops Gastrointestinal system: Abdomen is nondistended, soft and nontender. No organomegaly or masses felt. Normal bowel sounds heard. Central nervous system: Alert and oriented. No focal neurological deficits. Extremities: No pedal edema. Symmetric, strength 5/5   Skin: No rashes, lesions or ulcers Psychiatry: Judgement and insight appear normal. Mood & affect appropriate.    Data Reviewed: I have personally reviewed following labs and imaging studies  CBC: Recent Labs  Lab 05/12/19 1017 05/13/19 0409 05/14/19 0250  WBC 4.5 4.7 2.9*  HGB 16.5 14.1 13.3  HCT 50.7 42.8 40.9  MCV 93.9 93.9 95.6  PLT 329 270 856   Basic Metabolic Panel: Recent Labs  Lab 05/12/19 1017 05/12/19 1430 05/12/19 1630 05/12/19 1818 05/13/19 0409 05/14/19 0250  NA 138  --   --   --  136 136  K 3.3*  --   --  3.5 3.4* 4.0  CL 98  --   --   --  102 104  CO2 26  --   --   --  25 25  GLUCOSE 122*  --   --   --  117* 112*  BUN 9  --   --   --  11 16  CREATININE 1.07  --   --   --  0.97 1.06  CALCIUM 11.3*  --   --   --  9.2 9.1  MG  --  1.7  --   --   --  1.9  PHOS  --   --  3.1  --   --   --    GFR: Estimated Creatinine Clearance: 84.1 mL/min (by C-G formula based on SCr of 1.06 mg/dL). Liver Function Tests: No results for input(s): AST, ALT, ALKPHOS, BILITOT, PROT, ALBUMIN in the last 168 hours. No results for input(s): LIPASE, AMYLASE in the last 168 hours. No results for input(s): AMMONIA in the last 168 hours. Coagulation Profile: No results for input(s): INR, PROTIME in the last 168 hours. Cardiac Enzymes: No results for input(s): CKTOTAL, CKMB, CKMBINDEX, TROPONINI in the last 168 hours. BNP (last 3 results) No results for input(s): PROBNP in the last 8760 hours. HbA1C: No results for input(s): HGBA1C in the last 72  hours. CBG: No results for input(s): GLUCAP in the last 168 hours. Lipid Profile: No results for input(s): CHOL, HDL, LDLCALC, TRIG, CHOLHDL, LDLDIRECT in the last 72 hours. Thyroid Function Tests: No results for input(s): TSH, T4TOTAL, FREET4, T3FREE, THYROIDAB in the last 72 hours. Anemia Panel: No results for input(s): VITAMINB12, FOLATE, FERRITIN, TIBC, IRON, RETICCTPCT in the last 72 hours. Sepsis Labs: No results for input(s): PROCALCITON, LATICACIDVEN in the last 168 hours.  Recent Results (from the past 240 hour(s))  SARS Coronavirus 2 (CEPHEID - Performed in [place] hospital lab), Hosp Order     Status: None   Collection Time: 05/12/19 11:40 AM   Specimen: Nasopharyngeal Swab  Result Value Ref Range Status   SARS Coronavirus 2 NEGATIVE NEGATIVE Final    Comment: (NOTE) If result is NEGATIVE SARS-CoV-2 target nucleic acids are NOT DETECTED.  The SARS-CoV-2 RNA is generally detectable in upper and lower  respiratory specimens during the acute phase of infection. The lowest  concentration of SARS-CoV-2 viral copies this assay can detect is 250  copies / mL. A negative result does not preclude SARS-CoV-2 infection  and should not be used as the sole basis for treatment or other  patient management decisions.  A negative result may occur with  improper specimen collection / handling, submission of specimen other  than nasopharyngeal swab, presence of viral mutation(s) within the  areas targeted by this assay, and inadequate number of viral copies  (<250 copies / mL). A negative result must be combined with clinical  observations, patient history, and epidemiological information. If result is POSITIVE SARS-CoV-2 target nucleic acids are DETECTED. The SARS-CoV-2 RNA is generally detectable in upper and lower  respiratory specimens dur ing the acute phase of infection.  Positive  results are indicative of active infection with SARS-CoV-2.  Clinical  correlation with patient  history and other diagnostic information is  necessary to determine patient infection status.  Positive results do  not rule out bacterial infection or co-infection with other viruses. If result is PRESUMPTIVE POSTIVE SARS-CoV-2 nucleic acids MAY BE PRESENT.   A presumptive positive result was obtained on the submitted specimen  and confirmed on repeat testing.  While 2019 novel coronavirus  (SARS-CoV-2) nucleic acids may be present in the submitted sample  additional confirmatory testing may be necessary for epidemiological  and / or clinical management purposes  to differentiate between  SARS-CoV-2 and other Sarbecovirus currently known to infect humans.  If clinically indicated additional testing with an alternate test  methodology [phone]) is advised. The SARS-CoV-2 RNA is generally  detectable in upper and lower respiratory sp ecimens during the acute  phase of infection. The expected result is Negative. Fact Sheet for Patients:  StrictlyIdeas.no Fact Sheet for Healthcare Providers: BankingDealers.co.za This test is not yet approved or cleared by the [place] FDA and has been authorized for detection and/or diagnosis of SARS-CoV-2 by FDA under an Emergency Use Authorization (EUA).  This EUA will remain in effect (meaning this test can be used) for the duration of the COVID-19 declaration under Section 564(b)(1) of the Act, 21 U.S.C. section 360bbb-3(b)(1), unless the authorization is terminated or revoked sooner. Performed at [facility], [place] [address] [place], [place] [address]       Radiology Studies: Dg Chest 2 View  Result Date: 05/12/2019 CLINICAL DATA:  Chest pain, shortness of breath EXAM: CHEST - 2 VIEW COMPARISON:  09/13/2012 FINDINGS: Complete collapse of the right lung with large right pneumothorax. No evidence of tension. Heart is normal size. Possible nodule at the base of the left lung. Otherwise left  lung clear. No effusions. No acute bony abnormality. IMPRESSION: Complete collapse of the right lung with near large right pneumothorax. Question small nodule at the left lung base. This could be further evaluated with elective chest CT when patient is able. These results were called by telephone at the time of interpretation on 05/12/2019 at 10:52 am to Alyson Reedy, who verbally acknowledged these results. Electronically Signed   By: Rolm Baptise M.D.   On: 05/12/2019 10:55   Dg Chest Port 1 View  Result Date: 05/14/2019 CLINICAL DATA:  58 year old male with history of pneumothorax. EXAM: PORTABLE CHEST 1 VIEW COMPARISON:  Chest x-ray 05/13/2019. FINDINGS: Small bore pigtail drainage catheter is stable in position projecting over the lateral mid right hemithorax. Small residual pneumothorax  in the apex of the right hemithorax, measuring less than 5% of the volume of the right hemithorax, unchanged compared to the prior examination. Lung volumes are low with bibasilar subsegmental atelectasis. No definite pleural effusions. No evidence of pulmonary edema. Heart size is normal. Upper mediastinal contours are within normal limits. Small amount of subcutaneous emphysema in the right chest wall, similar to the prior study. IMPRESSION: 1. Overall, the radiographic appearance the chest is essentially unchanged with small right pneumothorax and right-sided chest tube which is unchanged in position. Electronically Signed   By: Vinnie Langton M.D.   On: 05/14/2019 08:16   Dg Chest Port 1 View  Result Date: 05/13/2019 CLINICAL DATA:  Large right pneumothorax, chest tube. EXAM: PORTABLE CHEST 1 VIEW COMPARISON:  05/12/2019 FINDINGS: Right chest tube remains in place. Small residual right apical pneumothorax, slightly increased in size since prior study. Right base atelectasis. Left lung clear. Heart is normal size. IMPRESSION: Right chest tube in place with small residual right apical pneumothorax, slightly  increased since most recent study. Electronically Signed   By: Rolm Baptise M.D.   On: 05/13/2019 09:00   Dg Chest Portable 1 View  Result Date: 05/12/2019 CLINICAL DATA:  Status post chest tube insertion. EXAM: PORTABLE CHEST 1 VIEW COMPARISON:  05/12/2019 at 10:30 a.m. FINDINGS: New right-sided chest tube lies along the lateral mid right hemithorax. There is near complete re-expansion of the right lung with only a minimal pneumothorax noted in the right upper hemithorax. Subcutaneous air is seen along the lateral right chest wall. There are linear opacities in the right mid to lower lung consistent with atelectasis. Lungs otherwise clear. No pleural effusion. Cardiac silhouette is normal in size.  No mediastinal hilar masses. IMPRESSION: 1. S/p right-sided chest tube placement near complete re-expansion of the right lung since the earlier study. Electronically Signed   By: Lajean Manes M.D.   On: 05/12/2019 12:45      Scheduled Meds: . amLODipine  5 mg Oral Daily  . enoxaparin (LOVENOX) injection  40 mg Subcutaneous Q24H  . fenofibrate  160 mg Oral Daily  . furosemide  20 mg Oral Daily  . polyethylene glycol  17 g Oral Daily  . simvastatin  20 mg Oral QPM   Continuous Infusions:   LOS: 2 days    Time spent: Total of 25 minutes spent with pt, greater than 50% of which was spent in discussion of  treatment, counseling and coordination of care   Chipper Oman, MD  How to contact the [facility] Attending or Consulting provider [place] or covering provider during after hours [place], for this patient?  1. Check the care team in [facility] and look for a) attending/consulting [facility] provider listed and b) the [facility] team listed 2. Log into [email or web address] and use [place]'s universal password to access. If you do not have the password, please contact the hospital operator. 3. Locate the [facility] provider you are looking for under [facility] and page to a number that you can be directly reached. 4. If you  still have difficulty reaching the provider, please page the [facility] (Director on Call) for the Hospitalists listed on amion for assistance.

## 2019-05-14 NOTE — Progress Notes (Signed)
PCCM Progress Note  Name: Jeremy Cunningham  DOB: 01-Feb-1961 MRN: [id number] Admission date: 05/12/2019 Consult date: 05/13/2019 Referring provider: Dr. Kathrynn Humble, ER CC: Pleuritic Rt chest pain  Brief description: 58 yo male smoker developed sudden onset of Rt sided pleuritic chest pain.  Found to have large Rt PTX on CXR and had chest tube placed 7/23 by EDP.  COVID and HIV negative from 05/12/19.  Subjective: Mild discomfort at chest tube site.  Breathing okay.  Vital signs: BP 117/79 (BP Location: Right Arm)   Pulse 81   Temp 98.2 F (36.8 C) (Oral)   Resp 18   Ht 5\' 9"  (1.753 m)   Wt 85 kg   SpO2 99%   BMI 27.67 kg/m   Physical exam:  General - alert Eyes - pupils reactive ENT - no sinus tenderness, no stridor Cardiac - regular rate/rhythm, no murmur Chest - equal breath sounds b/l, no wheezing or rales, Rt chest tube with small air leak Abdomen - soft, non tender, + bowel sounds Extremities - no cyanosis, clubbing, or edema Skin - no rashes Neuro - normal strength, moves extremities, follows commands Psych - normal mood and behavior  Labs: CMP Latest Ref Rng & Units 05/14/2019 05/13/2019 05/12/2019  Glucose 70 - 99 mg/dL 112(H) 117(H) -  BUN 6 - 20 mg/dL 16 11 -  Creatinine 0.61 - 1.24 mg/dL 1.06 0.97 -  Sodium 135 - 145 mmol/L 136 136 -  Potassium 3.5 - 5.1 mmol/L 4.0 3.4(L) 3.5  Chloride 98 - 111 mmol/L 104 102 -  CO2 22 - 32 mmol/L 25 25 -  Calcium 8.9 - 10.3 mg/dL 9.1 9.2 -   CBC Latest Ref Rng & Units 05/14/2019 05/13/2019 05/12/2019  WBC 4.0 - 10.5 K/uL 2.9(L) 4.7 4.5  Hemoglobin 13.0 - 17.0 g/dL 13.3 14.1 16.5  Hematocrit 39.0 - 52.0 % 40.9 42.8 50.7  Platelets 150 - 400 K/uL 256 270 329   CXR (reviewed by me) - small Rt apical PTX  Assessment/plan:  Spontaneous Rt pneumothorax. - still has small air leak - continue chest tube to -20 suction - f/u CXR 7/26 - chest tube care - will need CT chest once on water seal to further assess lung parenchyma  Hx of  tobacco abuse. - A1AT level from 7/23 was 102 - smoking cessation - will need outpt PFTs when more stable  Hx of HTN, HLD. - per primary team  Chesley Mires, MD [facility] Pulmonary/Critical Care 05/14/2019, 9:40 AM

## 2019-05-15 ENCOUNTER — Inpatient Hospital Stay (HOSPITAL_COMMUNITY): Payer: Medicare Other

## 2019-05-15 NOTE — Progress Notes (Signed)
PCCM Progress Note  Name: Jeremy Cunningham  DOB: December 03, 1960 MRN: [id number] Admission date: 05/12/2019 Consult date: 05/13/2019 Referring provider: Dr. Kathrynn Humble, ER CC: Pleuritic Rt chest pain  Brief Description:  58 yo male smoker developed sudden onset of Rt sided pleuritic chest pain.  Found to have large Rt PTX on CXR and had chest tube placed 7/23 by EDP.  COVID and HIV negative from 05/12/19.  Subjective:  Mild chest discomfort at chest tube site.  No cough.  Vital Signs:  BP 115/84 (BP Location: Right Arm)   Pulse 89   Temp 98.7 F (37.1 C) (Oral)   Resp 20   Ht 5\' 9"  (1.753 m)   Wt 85 kg   SpO2 98%   BMI 27.67 kg/m   Physical Exam:   General - alert Eyes - pupils reactive ENT - no sinus tenderness, no stridor Cardiac - regular rate/rhythm, no murmur Chest - equal breath sounds b/l, no wheezing or rales, chest tube in place on Rt >> no air leak Abdomen - soft, non tender, + bowel sounds Extremities - no cyanosis, clubbing, or edema Skin - no rashes Neuro - normal strength, moves extremities, follows commands Psych - normal mood and behavior  Labs:   CMP Latest Ref Rng & Units 05/14/2019 05/13/2019 05/12/2019  Glucose 70 - 99 mg/dL 112(H) 117(H) -  BUN 6 - 20 mg/dL 16 11 -  Creatinine 0.61 - 1.24 mg/dL 1.06 0.97 -  Sodium 135 - 145 mmol/L 136 136 -  Potassium 3.5 - 5.1 mmol/L 4.0 3.4(L) 3.5  Chloride 98 - 111 mmol/L 104 102 -  CO2 22 - 32 mmol/L 25 25 -  Calcium 8.9 - 10.3 mg/dL 9.1 9.2 -   CBC Latest Ref Rng & Units 05/14/2019 05/13/2019 05/12/2019  WBC 4.0 - 10.5 K/uL 2.9(L) 4.7 4.5  Hemoglobin 13.0 - 17.0 g/dL 13.3 14.1 16.5  Hematocrit 39.0 - 52.0 % 40.9 42.8 50.7  Platelets 150 - 400 K/uL 256 270 329   CXR (reviewed by me) - stable small Rt apical PTX  Assessment/Plan:   Spontaneous Rt pneumothorax. - changed to -10 suction on 7/26  - f/u CXR 7/27 - chest tube care - CT chest if air leak persists longer - bedside commode while he still needs chest tube  to suction to avoid disconnecting from suction  Hx of tobacco abuse. - A1AT level from 7/23 was 102 - smoking cessation - will need outpt PFTs when more stable  Hx of HTN, HLD. - per primary team  Chesley Mires, MD [facility] Pulmonary/Critical Care 05/15/2019, 2:14 PM

## 2019-05-15 NOTE — Progress Notes (Signed)
PROGRESS NOTE [facility]   Jeremy Cunningham   HCW:[id number] DOB: 1961-10-09  DOA: 05/12/2019 PCP: Elizabeth Palau, MD (Inactive)   Brief Narrative:  Per HPI:  Jeremy Cunningham a 58 y.o.malewith history h/ohypertension, hyperlipidemia, DDD who smokes 1 pack/week (states he quit 4 days back) presents with complaints of worsening dyspnea since yesterday. Patient states at his baseline he can walk more than 2 blocks, bicycle a good distance without any chest pain or shortness of breath. Sometime yesterday evening he noticed he had trouble breathing which progressively got worse and he could not sleep all night. He states he struggled to find a comfortable position to fall asleep last night. Early morning he started experiencing retrosternal squeezing pain, started at 4/10 and worsened to 8/10 by this morning, radiating to the back, associated with dyspnea/palpitations but not associated with nausea/vomiting. Work-up in the ED revealed complete collapse of the right lung with large right pneumothorax requiring urgent thoracostomy performed by ED physician and placement of right-sided pigtail/smallbore chest tube. EKG shows LVH pattern with nonspecific ST-T changes and prolonged QTC at 565 ms.High-sensitivity troponin x2 sets stable at 25. Patient denies any retrosternal chest discomfort currently but reports chest pain around the thoracostomy site. Patient denies any history of trauma, forceful coughing, history of COPD/emphysema or prior personal or family history of spontaneous pneumothorax.  Patient seen by pulmonary who will follow along and manage chest tubebutrequested admission to hospitalist service. Patient is currently saturating well at room air and chest tube placed to suction.  Patient was admitted for right-sided spontaneous pneumothorax and is status post chest tube placement.    Subjective: No acute complains.   Assessment & Plan:   Principal Problem:  Pneumothorax Active Problems:   Hypercholesteremia   HTN (hypertension)   Hypokalemia   Atypical chest pain  Right side spontaneous pneumothorax Unclear etiology.  Alpha-1 antitrypsin 102 normal PCCM managing chest tube. Patient will need CT chest repeated in few days.   Continue duo nebs as needed for shortness of breath Management per PCCM   Hypertension Stable, continue current medications. Echocardiogram shows 60 to 65% ejection fraction.  Mild hypokalemia Magnesium and potassium stable.  We will continue to monitor renal function.  Prolonged QTC Monitor electrolytes.  DVT prophylaxis: SCDs, Code Status: Full code Family Communication: Not at bedside Disposition Plan: Per CCM pending on chest tube removal.   Consultants:   PCCM  Procedures:   Chest tube placement 7/23  Antimicrobials: Anti-infectives (From admission, onward)   Start     Dose/Rate Route Frequency Ordered Stop   05/12/19 1515  doxycycline (VIBRAMYCIN) capsule 100 mg  Status:  Discontinued     100 mg Oral 2 times daily 05/12/19 1501 05/12/19 1514       Objective: Vitals:   05/14/19 1614 05/14/19 2133 05/15/19 0425 05/15/19 1329  BP: 109/88 114/78 113/81 115/84  Pulse: 84 85 73 89  Resp: 18 18 18 20   Temp: 98.1 F (36.7 C) 98.4 F (36.9 C) 98.4 F (36.9 C) 98.7 F (37.1 C)  TempSrc: Oral Oral Oral Oral  SpO2: 98% 99% 100% 98%  Weight:      Height:        Intake/Output Summary (Last 24 hours) at 05/15/2019 1716 Last data filed at 05/15/2019 1422 Gross per 24 hour  Intake 360 ml  Output 2240 ml  Net -1880 ml   Filed Weights   05/12/19 1009  Weight: 85 kg    Examination:  General exam: Appears calm and comfortable  HEENT: AC/AT, PERRLA, OP moist and clear Respiratory system: Clear to auscultation. No wheezes,crackle or rhonchi.  Right chest tube with small air leak. Cardiovascular system: S1 & S2 heard, RRR. No JVD, murmurs, rubs or gallops Gastrointestinal system: Abdomen  is nondistended, soft and nontender. No organomegaly or masses felt. Normal bowel sounds heard. Central nervous system: Alert and oriented. No focal neurological deficits. Extremities: No pedal edema. Symmetric, strength 5/5   Skin: No rashes, lesions or ulcers Psychiatry: Judgement and insight appear normal. Mood & affect appropriate.    Data Reviewed: I have personally reviewed following labs and imaging studies  CBC: Recent Labs  Lab 05/12/19 1017 05/13/19 0409 05/14/19 0250  WBC 4.5 4.7 2.9*  HGB 16.5 14.1 13.3  HCT 50.7 42.8 40.9  MCV 93.9 93.9 95.6  PLT 329 270 681   Basic Metabolic Panel: Recent Labs  Lab 05/12/19 1017 05/12/19 1430 05/12/19 1630 05/12/19 1818 05/13/19 0409 05/14/19 0250  NA 138  --   --   --  136 136  K 3.3*  --   --  3.5 3.4* 4.0  CL 98  --   --   --  102 104  CO2 26  --   --   --  25 25  GLUCOSE 122*  --   --   --  117* 112*  BUN 9  --   --   --  11 16  CREATININE 1.07  --   --   --  0.97 1.06  CALCIUM 11.3*  --   --   --  9.2 9.1  MG  --  1.7  --   --   --  1.9  PHOS  --   --  3.1  --   --   --    GFR: Estimated Creatinine Clearance: 84.1 mL/min (by C-G formula based on SCr of 1.06 mg/dL). Liver Function Tests: No results for input(s): AST, ALT, ALKPHOS, BILITOT, PROT, ALBUMIN in the last 168 hours. No results for input(s): LIPASE, AMYLASE in the last 168 hours. No results for input(s): AMMONIA in the last 168 hours. Coagulation Profile: No results for input(s): INR, PROTIME in the last 168 hours. Cardiac Enzymes: No results for input(s): CKTOTAL, CKMB, CKMBINDEX, TROPONINI in the last 168 hours. BNP (last 3 results) No results for input(s): PROBNP in the last 8760 hours. HbA1C: No results for input(s): HGBA1C in the last 72 hours. CBG: No results for input(s): GLUCAP in the last 168 hours. Lipid Profile: No results for input(s): CHOL, HDL, LDLCALC, TRIG, CHOLHDL, LDLDIRECT in the last 72 hours. Thyroid Function Tests: No results  for input(s): TSH, T4TOTAL, FREET4, T3FREE, THYROIDAB in the last 72 hours. Anemia Panel: No results for input(s): VITAMINB12, FOLATE, FERRITIN, TIBC, IRON, RETICCTPCT in the last 72 hours. Sepsis Labs: No results for input(s): PROCALCITON, LATICACIDVEN in the last 168 hours.  Recent Results (from the past 240 hour(s))  SARS Coronavirus 2 (CEPHEID - Performed in [place] hospital lab), Hosp Order     Status: None   Collection Time: 05/12/19 11:40 AM   Specimen: Nasopharyngeal Swab  Result Value Ref Range Status   SARS Coronavirus 2 NEGATIVE NEGATIVE Final    Comment: (NOTE) If result is NEGATIVE SARS-CoV-2 target nucleic acids are NOT DETECTED. The SARS-CoV-2 RNA is generally detectable in upper and lower  respiratory specimens during the acute phase of infection. The lowest  concentration of SARS-CoV-2 viral copies this assay can detect is 250  copies / mL. A negative  result does not preclude SARS-CoV-2 infection  and should not be used as the sole basis for treatment or other  patient management decisions.  A negative result may occur with  improper specimen collection / handling, submission of specimen other  than nasopharyngeal swab, presence of viral mutation(s) within the  areas targeted by this assay, and inadequate number of viral copies  (<250 copies / mL). A negative result must be combined with clinical  observations, patient history, and epidemiological information. If result is POSITIVE SARS-CoV-2 target nucleic acids are DETECTED. The SARS-CoV-2 RNA is generally detectable in upper and lower  respiratory specimens dur ing the acute phase of infection.  Positive  results are indicative of active infection with SARS-CoV-2.  Clinical  correlation with patient history and other diagnostic information is  necessary to determine patient infection status.  Positive results do  not rule out bacterial infection or co-infection with other viruses. If result is PRESUMPTIVE  POSTIVE SARS-CoV-2 nucleic acids MAY BE PRESENT.   A presumptive positive result was obtained on the submitted specimen  and confirmed on repeat testing.  While 2019 novel coronavirus  (SARS-CoV-2) nucleic acids may be present in the submitted sample  additional confirmatory testing may be necessary for epidemiological  and / or clinical management purposes  to differentiate between  SARS-CoV-2 and other Sarbecovirus currently known to infect humans.  If clinically indicated additional testing with an alternate test  methodology [phone]) is advised. The SARS-CoV-2 RNA is generally  detectable in upper and lower respiratory sp ecimens during the acute  phase of infection. The expected result is Negative. Fact Sheet for Patients:  StrictlyIdeas.no Fact Sheet for Healthcare Providers: BankingDealers.co.za This test is not yet approved or cleared by the [place] FDA and has been authorized for detection and/or diagnosis of SARS-CoV-2 by FDA under an Emergency Use Authorization (EUA).  This EUA will remain in effect (meaning this test can be used) for the duration of the COVID-19 declaration under Section 564(b)(1) of the Act, 21 U.S.C. section 360bbb-3(b)(1), unless the authorization is terminated or revoked sooner. Performed at [facility], [place] [address] [place], [place] [address]       Radiology Studies: Dg Chest [place] 1 View  Result Date: 05/15/2019 CLINICAL DATA:  58 year old male with history of pneumothorax. Follow-up study. EXAM: PORTABLE CHEST 1 VIEW COMPARISON:  Chest x-ray 05/14/2019. FINDINGS: Previously noted small bore right-sided chest tube remains stable in position in the lateral aspect of the right hemithorax. Small right apical pneumothorax occupying less than 5% of the volume of the right hemithorax, unchanged. Small amount of subcutaneous emphysema in the right lateral chest wall, also similar to the prior  examination. Lung volumes are low. No consolidative airspace disease. No pleural effusions. No evidence of pulmonary edema. Heart size is normal. Upper mediastinal contours are within normal limits. IMPRESSION: 1. Unchanged radiographic appearance the chest with small persistent right-sided pneumothorax, and right-sided chest tube (stable in position), as above. Electronically Signed   By: Vinnie Langton M.D.   On: 05/15/2019 07:39   Dg Chest Port 1 View  Result Date: 05/14/2019 CLINICAL DATA:  58 year old male with history of pneumothorax. EXAM: PORTABLE CHEST 1 VIEW COMPARISON:  Chest x-ray 05/13/2019. FINDINGS: Small bore pigtail drainage catheter is stable in position projecting over the lateral mid right hemithorax. Small residual pneumothorax in the apex of the right hemithorax, measuring less than 5% of the volume of the right hemithorax, unchanged compared to the prior examination. Lung volumes are  low with bibasilar subsegmental atelectasis. No definite pleural effusions. No evidence of pulmonary edema. Heart size is normal. Upper mediastinal contours are within normal limits. Small amount of subcutaneous emphysema in the right chest wall, similar to the prior study. IMPRESSION: 1. Overall, the radiographic appearance the chest is essentially unchanged with small right pneumothorax and right-sided chest tube which is unchanged in position. Electronically Signed   By: Vinnie Langton M.D.   On: 05/14/2019 08:16      Scheduled Meds: . amLODipine  5 mg Oral Daily  . enoxaparin (LOVENOX) injection  40 mg Subcutaneous Q24H  . fenofibrate  160 mg Oral Daily  . furosemide  20 mg Oral Daily  . polyethylene glycol  17 g Oral Daily  . simvastatin  20 mg Oral QPM   Continuous Infusions:   LOS: 3 days    Time spent: Total of 25 minutes spent with pt, greater than 50% of which was spent in discussion of  treatment, counseling and coordination of care   Berle Mull, MD  How to contact the  [facility] Attending or Consulting provider [place] or covering provider during after hours [place], for this patient?  1. Check the care team in [facility] and look for a) attending/consulting [facility] provider listed and b) the [facility] team listed 2. Log into [email or web address] and use [place]'s universal password to access. If you do not have the password, please contact the hospital operator. 3. Locate the [facility] provider you are looking for under [facility] and page to a number that you can be directly reached. 4. If you still have difficulty reaching the provider, please page the [facility] (Director on Call) for the Hospitalists listed on amion for assistance.

## 2019-05-16 ENCOUNTER — Inpatient Hospital Stay (HOSPITAL_COMMUNITY): Payer: Medicare Other

## 2019-05-16 ENCOUNTER — Other Ambulatory Visit: Payer: Self-pay

## 2019-05-16 NOTE — Progress Notes (Signed)
[facility] PROGRESS NOTE  Jeremy Cunningham XVQ:[id number] DOB: 1961-06-06 DOA: 05/12/2019 PCP: Elizabeth Palau, MD (Inactive)  Brief summary   58 y.o.malewith history h/ohypertension, hyperlipidemia, DDD who smokes 1 pack/week (states he quit 4 days back) presents with complaints of worsening dyspnea for 1 day. Early morning he started experiencing retrosternal squeezing pain, started at 4/10 and worsened to 8/10 by this morning, radiating to the back, associated with dyspnea/palpitations but not associated with nausea/vomiting. Work-up in the ED revealed complete collapse of the right lung with large right pneumothorax requiring urgent thoracostomy performed by ED physician and placement of right-sided pigtail/smallbore chest tube.   Assessment/Plan:  Right side spontaneous pneumothorax. Resolved. Unclear etiology.  Alpha-1 antitrypsin 102 normal. Repeat CXR: improved. Continue duo nebs as needed for shortness of breath. Management per PCCM   Hypertension. Stable, continue current medications.. Echocardiogram shows 60 to 65% ejection fraction.  Mild hypokalemia. Magnesium and potassium stable.  We will continue to monitor renal function.  Prolonged QTC. Monitor electrolytes. repeat ecg  Code Status: full Family Communication: d/w patient, RN (indicate person spoken with, relationship, and if by phone, the number) Disposition Plan: remains inpatient    Consultants:  Pulmonology   Procedures:  Chest tube drain  Antibiotics: Anti-infectives (From admission, onward)   Start     Dose/Rate Route Frequency Ordered Stop   05/12/19 1515  doxycycline (VIBRAMYCIN) capsule 100 mg  Status:  Discontinued     100 mg Oral 2 times daily 05/12/19 1501 05/12/19 1514        (indicate start date, and stop date if known)  HPI/Subjective: No acute distress. Reports feeling much better   Objective: Vitals:   05/15/19 2010 05/16/19 0433  BP: 121/89 110/76  Pulse: 77 70   Resp: 17 18  Temp: 98.9 F (37.2 C) 98.1 F (36.7 C)  SpO2: 98% 99%    Intake/Output Summary (Last 24 hours) at 05/16/2019 1307 Last data filed at 05/16/2019 1000 Gross per 24 hour  Intake 1700 ml  Output 2500 ml  Net -800 ml   Filed Weights   05/12/19 1009  Weight: 85 kg    Exam:   General:  No distress   Cardiovascular: s1,s2 rrr  Respiratory: diminished at bases   Abdomen: soft, nt   Musculoskeletal: no leg edema    Data Reviewed: Basic Metabolic Panel: Recent Labs  Lab 05/12/19 1017 05/12/19 1430 05/12/19 1630 05/12/19 1818 05/13/19 0409 05/14/19 0250  NA 138  --   --   --  136 136  K 3.3*  --   --  3.5 3.4* 4.0  CL 98  --   --   --  102 104  CO2 26  --   --   --  25 25  GLUCOSE 122*  --   --   --  117* 112*  BUN 9  --   --   --  11 16  CREATININE 1.07  --   --   --  0.97 1.06  CALCIUM 11.3*  --   --   --  9.2 9.1  MG  --  1.7  --   --   --  1.9  PHOS  --   --  3.1  --   --   --    Liver Function Tests: No results for input(s): AST, ALT, ALKPHOS, BILITOT, PROT, ALBUMIN in the last 168 hours. No results for input(s): LIPASE, AMYLASE in the last 168 hours. No results for input(s): AMMONIA in the  last 168 hours. CBC: Recent Labs  Lab 05/12/19 1017 05/13/19 0409 05/14/19 0250  WBC 4.5 4.7 2.9*  HGB 16.5 14.1 13.3  HCT 50.7 42.8 40.9  MCV 93.9 93.9 95.6  PLT 329 270 256   Cardiac Enzymes: No results for input(s): CKTOTAL, CKMB, CKMBINDEX, TROPONINI in the last 168 hours. BNP (last 3 results) No results for input(s): BNP in the last 8760 hours.  ProBNP (last 3 results) No results for input(s): PROBNP in the last 8760 hours.  CBG: No results for input(s): GLUCAP in the last 168 hours.  Recent Results (from the past 240 hour(s))  SARS Coronavirus 2 (CEPHEID - Performed in [place] hospital lab), Hosp Order     Status: None   Collection Time: 05/12/19 11:40 AM   Specimen: Nasopharyngeal Swab  Result Value Ref Range Status   SARS  Coronavirus 2 NEGATIVE NEGATIVE Final    Comment: (NOTE) If result is NEGATIVE SARS-CoV-2 target nucleic acids are NOT DETECTED. The SARS-CoV-2 RNA is generally detectable in upper and lower  respiratory specimens during the acute phase of infection. The lowest  concentration of SARS-CoV-2 viral copies this assay can detect is 250  copies / mL. A negative result does not preclude SARS-CoV-2 infection  and should not be used as the sole basis for treatment or other  patient management decisions.  A negative result may occur with  improper specimen collection / handling, submission of specimen other  than nasopharyngeal swab, presence of viral mutation(s) within the  areas targeted by this assay, and inadequate number of viral copies  (<250 copies / mL). A negative result must be combined with clinical  observations, patient history, and epidemiological information. If result is POSITIVE SARS-CoV-2 target nucleic acids are DETECTED. The SARS-CoV-2 RNA is generally detectable in upper and lower  respiratory specimens dur ing the acute phase of infection.  Positive  results are indicative of active infection with SARS-CoV-2.  Clinical  correlation with patient history and other diagnostic information is  necessary to determine patient infection status.  Positive results do  not rule out bacterial infection or co-infection with other viruses. If result is PRESUMPTIVE POSTIVE SARS-CoV-2 nucleic acids MAY BE PRESENT.   A presumptive positive result was obtained on the submitted specimen  and confirmed on repeat testing.  While 2019 novel coronavirus  (SARS-CoV-2) nucleic acids may be present in the submitted sample  additional confirmatory testing may be necessary for epidemiological  and / or clinical management purposes  to differentiate between  SARS-CoV-2 and other Sarbecovirus currently known to infect humans.  If clinically indicated additional testing with an alternate test   methodology [phone]) is advised. The SARS-CoV-2 RNA is generally  detectable in upper and lower respiratory sp ecimens during the acute  phase of infection. The expected result is Negative. Fact Sheet for Patients:  StrictlyIdeas.no Fact Sheet for Healthcare Providers: BankingDealers.co.za This test is not yet approved or cleared by the [place] FDA and has been authorized for detection and/or diagnosis of SARS-CoV-2 by FDA under an Emergency Use Authorization (EUA).  This EUA will remain in effect (meaning this test can be used) for the duration of the COVID-19 declaration under Section 564(b)(1) of the Act, 21 U.S.C. section 360bbb-3(b)(1), unless the authorization is terminated or revoked sooner. Performed at [facility], [place] [address] [place], [place] [address]      Studies: Dg Chest Port 1 View  Result Date: 05/16/2019 CLINICAL DATA:  Right pneumothorax. EXAM: PORTABLE CHEST 1  VIEW COMPARISON:  05/15/2019. FINDINGS: Right chest tube in unchanged position. No pneumothorax noted on today's exam. Mild right base subsegmental atelectasis. No pleural effusion. Heart size stable. No acute bony abnormality. Stable mild right chest wall subcutaneous emphysema. IMPRESSION: 1. Right chest tube in unchanged position. No pneumothorax noted on today's exam. Stable mild right chest wall subcutaneous emphysema. 2.  Mild right base subsegmental atelectasis. Electronically Signed   By: Marcello Moores  Register   On: 05/16/2019 07:09   Dg Chest Port 1 View  Result Date: 05/15/2019 CLINICAL DATA:  58 year old male with history of pneumothorax. Follow-up study. EXAM: PORTABLE CHEST 1 VIEW COMPARISON:  Chest x-ray 05/14/2019. FINDINGS: Previously noted small bore right-sided chest tube remains stable in position in the lateral aspect of the right hemithorax. Small right apical pneumothorax occupying less than 5% of the volume of the right hemithorax,  unchanged. Small amount of subcutaneous emphysema in the right lateral chest wall, also similar to the prior examination. Lung volumes are low. No consolidative airspace disease. No pleural effusions. No evidence of pulmonary edema. Heart size is normal. Upper mediastinal contours are within normal limits. IMPRESSION: 1. Unchanged radiographic appearance the chest with small persistent right-sided pneumothorax, and right-sided chest tube (stable in position), as above. Electronically Signed   By: Vinnie Langton M.D.   On: 05/15/2019 07:39    Scheduled Meds: . amLODipine  5 mg Oral Daily  . enoxaparin (LOVENOX) injection  40 mg Subcutaneous Q24H  . fenofibrate  160 mg Oral Daily  . furosemide  20 mg Oral Daily  . polyethylene glycol  17 g Oral Daily  . simvastatin  20 mg Oral QPM   Continuous Infusions:  Principal Problem:   Pneumothorax Active Problems:   Hypercholesteremia   HTN (hypertension)   Hypokalemia   Atypical chest pain    Time spent: >35 minutes     Kinnie Feil  [facility] Pager [phone]. If 7PM-7AM, please contact night-coverage at [email or web address], password [facility] 05/16/2019, 1:07 PM  LOS: 4 days

## 2019-05-16 NOTE — Progress Notes (Signed)
Spoke with patient briefly. Pigtail chest tube to right chest to [place] drainage system. Suction at 10cm. Level 1 airleak with breathing and coughing, bubbling is intermittent. Dressing is intact, no subcutaneous air. Spoke with patient briefly about his plan for quitting smoking. He is motivated to quit and is taking advantage of his hospital stay to make a lasting quit attempt. His prior quit attempt lasted 1 year before he relapsed. He has nicotine gum and patches at home. His brother has quit cigarettes and is a support for him. He also has information on 1800 QUITNOW from his primary care and understands how to use this service. Coached patient briefly on recognizing and avoiding triggers or having a plan for when they occur.

## 2019-05-16 NOTE — Plan of Care (Signed)
  Problem: Education: Goal: Knowledge of General Education information will improve Description: Including pain rating scale, medication(s)/side effects and non-pharmacologic comfort measures Outcome: Progressing   Problem: Health Behavior/Discharge Planning: Goal: Ability to manage health-related needs will improve Outcome: Progressing   Problem: Clinical Measurements: Goal: Ability to maintain clinical measurements within normal limits will improve Outcome: Progressing Goal: Will remain free from infection Outcome: Progressing Goal: Respiratory complications will improve Outcome: Progressing   Problem: Activity: Goal: Risk for activity intolerance will decrease Outcome: Progressing   Problem: Nutrition: Goal: Adequate nutrition will be maintained Outcome: Progressing   Problem: Coping: Goal: Level of anxiety will decrease Outcome: Progressing   Problem: Elimination: Goal: Will not experience complications related to bowel motility Outcome: Progressing Goal: Will not experience complications related to urinary retention Outcome: Progressing   Problem: Pain Managment: Goal: General experience of comfort will improve Outcome: Progressing   Problem: Safety: Goal: Ability to remain free from injury will improve Outcome: Progressing   Problem: Skin Integrity: Goal: Risk for impaired skin integrity will decrease Outcome: Progressing   

## 2019-05-16 NOTE — Progress Notes (Signed)
PCCM Progress Note  Name: Jeremy Cunningham  DOB: 1961/07/16 MRN: [id number] Admission date: 05/12/2019 Consult date: 05/13/2019 Referring provider: Dr. Kathrynn Humble, ER CC: Pleuritic Rt chest pain  Brief Description:  58 yo male smoker developed sudden onset of Rt sided pleuritic chest pain.  Found to have large Rt PTX on CXR and had chest tube placed 7/23 by EDP.  COVID and HIV negative from 05/12/19.  Subjective:  Mild soreness at chest tube site.  Denies cough or SOB CXR w/full re-expansion on 10 cm of suction  Vital Signs:  BP 110/76 (BP Location: Right Arm)   Pulse 70   Temp 98.1 F (36.7 C) (Oral)   Resp 18   Ht 5\' 9"  (1.753 m)   Wt 85 kg   SpO2 99%   BMI 27.67 kg/m   Physical Exam:   General:  Adult male sitting in bed in NAD HEENT: MM pink/moist Neuro: Alert, oriented, non focal  CV: rr, no murmur PULM:  Clear BS, no wheeze, right lateral CT- pigtail, to 10 cm sxn, no air leak, minimal serous drainage GI: soft, bs active  Extremities: warm/dry, no edema  Skin: no rashes   Labs:   CMP Latest Ref Rng & Units 05/14/2019 05/13/2019 05/12/2019  Glucose 70 - 99 mg/dL 112(H) 117(H) -  BUN 6 - 20 mg/dL 16 11 -  Creatinine 0.61 - 1.24 mg/dL 1.06 0.97 -  Sodium 135 - 145 mmol/L 136 136 -  Potassium 3.5 - 5.1 mmol/L 4.0 3.4(L) 3.5  Chloride 98 - 111 mmol/L 104 102 -  CO2 22 - 32 mmol/L 25 25 -  Calcium 8.9 - 10.3 mg/dL 9.1 9.2 -   CBC Latest Ref Rng & Units 05/14/2019 05/13/2019 05/12/2019  WBC 4.0 - 10.5 K/uL 2.9(L) 4.7 4.5  Hemoglobin 13.0 - 17.0 g/dL 13.3 14.1 16.5  Hematocrit 39.0 - 52.0 % 40.9 42.8 50.7  Platelets 150 - 400 K/uL 256 270 329   CXR (reviewed by me) - stable small Rt apical PTX  Assessment/Plan:   Spontaneous Rt pneumothorax. - change to waterseal 7/27 - f/u CXR 7/28 or sooner if becomes symptomatic - chest tube care  Hx of tobacco abuse. - A1AT level from 7/23 was 102 - smoking cessation - will need outpt PFTs/ follow up  Hx of HTN, HLD. - per  primary team   Kennieth Rad, MSN, AGACNP-BC [facility] Pgr: [phone] or if no answer [phone] 05/16/2019, 2:17 PM

## 2019-05-17 ENCOUNTER — Inpatient Hospital Stay (HOSPITAL_COMMUNITY): Payer: Medicare Other

## 2019-05-17 ENCOUNTER — Other Ambulatory Visit: Payer: Self-pay

## 2019-05-17 NOTE — Care Management Important Message (Signed)
Important Message  Patient Details  Name: Jeremy Cunningham MRN: [id number] Date of Birth: 1960/10/25   Medicare Important Message Given:  Yes     Memory Argue 05/17/2019, 11:12 AM   SIGNED

## 2019-05-17 NOTE — Progress Notes (Addendum)
PCCM Progress Note  Name: Jeremy Cunningham  DOB: 1961/02/17 MRN: [id number] Admission date: 05/12/2019 Consult date: 05/13/2019 Referring provider: Dr. Kathrynn Humble, ER CC: Pleuritic Rt chest pain  Brief Description:  58 yo male smoker developed sudden onset of Rt sided pleuritic chest pain.  Found to have large Rt PTX on CXR and had chest tube placed 7/23 by EDP.  COVID and HIV negative from 05/12/19.  Subjective:  Denies cough or SOB, Dressing site is slightly tender to touch On RA CT to water seal with scan straw colored drainage CXR 7/28>> Tiny Right sided pneumothorax  With mild right base atelectasis   Vital Signs:  BP 117/88 (BP Location: Right Arm)   Pulse 74   Temp 97.8 F (36.6 C) (Oral)   Resp 18   Ht 5\' 9"  (1.753 m)   Wt 85 kg   SpO2 100%   BMI 27.67 kg/m   Physical Exam:   General:  Adult male sitting in bed in NAD HEENT: NCAT, MM pink/moist, No LAD Neuro: Alert, oriented, non focal  CV:S1, S2,  rr, no murmur PULM:  Clear BS, no wheeze, right lateral CT- pigtail, to WS, no air leak, minimal serous drainage GI: soft, bs active, NT, ND. Body mass index is 27.67 kg/m.  Extremities: warm/dry, no edema  Skin: no rashes , no lesions, dressing to right upper chest  Labs:   CMP Latest Ref Rng & Units 05/14/2019 05/13/2019 05/12/2019  Glucose 70 - 99 mg/dL 112(H) 117(H) -  BUN 6 - 20 mg/dL 16 11 -  Creatinine 0.61 - 1.24 mg/dL 1.06 0.97 -  Sodium 135 - 145 mmol/L 136 136 -  Potassium 3.5 - 5.1 mmol/L 4.0 3.4(L) 3.5  Chloride 98 - 111 mmol/L 104 102 -  CO2 22 - 32 mmol/L 25 25 -  Calcium 8.9 - 10.3 mg/dL 9.1 9.2 -   CBC Latest Ref Rng & Units 05/14/2019 05/13/2019 05/12/2019  WBC 4.0 - 10.5 K/uL 2.9(L) 4.7 4.5  Hemoglobin 13.0 - 17.0 g/dL 13.3 14.1 16.5  Hematocrit 39.0 - 52.0 % 40.9 42.8 50.7  Platelets 150 - 400 K/uL 256 270 329   CXR (reviewed by me) - stable small Rt apical PTX  Assessment/Plan:   Spontaneous Rt pneumothorax. Suction discontinued 7/27>> to Water  seal CXR 7/28 with tiny right sided pneumothorax, Persistent mild right base subsegmental atelectasis - Continue CT to water seal - f/u CXR 7/28 pm, and 7/29 am, or sooner if becomes symptomatic - Oxygen at 2 L [place] to reduce partial pressure of nitrogen in the alveoli> diffusion gradient accelerates resolution - chest tube care - OOB to Chair - IS  Hx of tobacco abuse. - Current Every day smoker - A1AT level from 7/23 was 102 - smoking cessation - will need outpt PFTs/ follow up  Hx of HTN, HLD. - per primary team   Magdalen Spatz,  MSN, AGACNP-BC [facility] Pgr: [phone] or if no answer [phone] 05/17/2019, 10:38 AM

## 2019-05-17 NOTE — Progress Notes (Signed)
[facility] PROGRESS NOTE  Jeremy Cunningham WUJ:[id number] DOB: 1961-02-28 DOA: 05/12/2019 PCP: Elizabeth Palau, MD (Inactive)  Brief summary   58 y.o.malewith history h/ohypertension, hyperlipidemia, DDD who smokes 1 pack/week (states he quit 4 days back) presents with complaints of worsening dyspnea for 1 day. Early morning he started experiencing retrosternal squeezing pain, started at 4/10 and worsened to 8/10 by this morning, radiating to the back, associated with dyspnea/palpitations but not associated with nausea/vomiting. Work-up in the ED revealed complete collapse of the right lung with large right pneumothorax requiring urgent thoracostomy performed by ED physician and placement of right-sided pigtail/smallbore chest tube.   Assessment/Plan:  Right side spontaneous pneumothorax. Resolved. Unclear etiology.  Alpha-1 antitrypsin 102 normal. Repeat CXR: improved. Continue duo nebs as needed for shortness of breath.  -chest tube->water seal. Management per PCCM   Hypertension. Stable, continue current medications.. Echocardiogram shows 60 to 65% ejection fraction.  Mild hypokalemia. Magnesium and potassium stable.  We will continue to monitor renal function.  Prolonged QTC. Monitor electrolytes. repeat ecg  Code Status: full Family Communication: d/w patient, RN (indicate person spoken with, relationship, and if by phone, the number) Disposition Plan: remains inpatient    Consultants:  Pulmonology   Procedures:  Chest tube drain  Antibiotics: Anti-infectives (From admission, onward)   Start     Dose/Rate Route Frequency Ordered Stop   05/12/19 1515  doxycycline (VIBRAMYCIN) capsule 100 mg  Status:  Discontinued     100 mg Oral 2 times daily 05/12/19 1501 05/12/19 1514       (indicate start date, and stop date if known)  HPI/Subjective: No acute distress. Reports feeling much better   Objective: Vitals:   05/16/19 2026 05/17/19 0519  BP: 122/80  117/88  Pulse: 77 74  Resp: 18 18  Temp: 98.3 F (36.8 C) 97.8 F (36.6 C)  SpO2: 99% 100%    Intake/Output Summary (Last 24 hours) at 05/17/2019 1318 Last data filed at 05/17/2019 9562 Gross per 24 hour  Intake 960 ml  Output 2755 ml  Net -1795 ml   Filed Weights   05/12/19 1009  Weight: 85 kg    Exam:   General:  No distress   Cardiovascular: s1,s2 rrr  Respiratory: diminished at bases   Abdomen: soft, nt   Musculoskeletal: no leg edema    Data Reviewed: Basic Metabolic Panel: Recent Labs  Lab 05/12/19 1017 05/12/19 1430 05/12/19 1630 05/12/19 1818 05/13/19 0409 05/14/19 0250  NA 138  --   --   --  136 136  K 3.3*  --   --  3.5 3.4* 4.0  CL 98  --   --   --  102 104  CO2 26  --   --   --  25 25  GLUCOSE 122*  --   --   --  117* 112*  BUN 9  --   --   --  11 16  CREATININE 1.07  --   --   --  0.97 1.06  CALCIUM 11.3*  --   --   --  9.2 9.1  MG  --  1.7  --   --   --  1.9  PHOS  --   --  3.1  --   --   --    Liver Function Tests: No results for input(s): AST, ALT, ALKPHOS, BILITOT, PROT, ALBUMIN in the last 168 hours. No results for input(s): LIPASE, AMYLASE in the last 168 hours. No results for input(s):  AMMONIA in the last 168 hours. CBC: Recent Labs  Lab 05/12/19 1017 05/13/19 0409 05/14/19 0250  WBC 4.5 4.7 2.9*  HGB 16.5 14.1 13.3  HCT 50.7 42.8 40.9  MCV 93.9 93.9 95.6  PLT 329 270 256   Cardiac Enzymes: No results for input(s): CKTOTAL, CKMB, CKMBINDEX, TROPONINI in the last 168 hours. BNP (last 3 results) No results for input(s): BNP in the last 8760 hours.  ProBNP (last 3 results) No results for input(s): PROBNP in the last 8760 hours.  CBG: No results for input(s): GLUCAP in the last 168 hours.  Recent Results (from the past 240 hour(s))  SARS Coronavirus 2 (CEPHEID - Performed in [place] hospital lab), Hosp Order     Status: None   Collection Time: 05/12/19 11:40 AM   Specimen: Nasopharyngeal Swab  Result Value Ref  Range Status   SARS Coronavirus 2 NEGATIVE NEGATIVE Final    Comment: (NOTE) If result is NEGATIVE SARS-CoV-2 target nucleic acids are NOT DETECTED. The SARS-CoV-2 RNA is generally detectable in upper and lower  respiratory specimens during the acute phase of infection. The lowest  concentration of SARS-CoV-2 viral copies this assay can detect is 250  copies / mL. A negative result does not preclude SARS-CoV-2 infection  and should not be used as the sole basis for treatment or other  patient management decisions.  A negative result may occur with  improper specimen collection / handling, submission of specimen other  than nasopharyngeal swab, presence of viral mutation(s) within the  areas targeted by this assay, and inadequate number of viral copies  (<250 copies / mL). A negative result must be combined with clinical  observations, patient history, and epidemiological information. If result is POSITIVE SARS-CoV-2 target nucleic acids are DETECTED. The SARS-CoV-2 RNA is generally detectable in upper and lower  respiratory specimens dur ing the acute phase of infection.  Positive  results are indicative of active infection with SARS-CoV-2.  Clinical  correlation with patient history and other diagnostic information is  necessary to determine patient infection status.  Positive results do  not rule out bacterial infection or co-infection with other viruses. If result is PRESUMPTIVE POSTIVE SARS-CoV-2 nucleic acids MAY BE PRESENT.   A presumptive positive result was obtained on the submitted specimen  and confirmed on repeat testing.  While 2019 novel coronavirus  (SARS-CoV-2) nucleic acids may be present in the submitted sample  additional confirmatory testing may be necessary for epidemiological  and / or clinical management purposes  to differentiate between  SARS-CoV-2 and other Sarbecovirus currently known to infect humans.  If clinically indicated additional testing with an  alternate test  methodology [phone]) is advised. The SARS-CoV-2 RNA is generally  detectable in upper and lower respiratory sp ecimens during the acute  phase of infection. The expected result is Negative. Fact Sheet for Patients:  StrictlyIdeas.no Fact Sheet for Healthcare Providers: BankingDealers.co.za This test is not yet approved or cleared by the [place] FDA and has been authorized for detection and/or diagnosis of SARS-CoV-2 by FDA under an Emergency Use Authorization (EUA).  This EUA will remain in effect (meaning this test can be used) for the duration of the COVID-19 declaration under Section 564(b)(1) of the Act, 21 U.S.C. section 360bbb-3(b)(1), unless the authorization is terminated or revoked sooner. Performed at [facility], [place] [address], [place], [place] [address]      Studies: Dg Chest Port 1 View  Result Date: 05/17/2019 CLINICAL DATA:  Pneumothorax.  Chest  tube. EXAM: PORTABLE CHEST 1 VIEW COMPARISON:  05/16/2019. FINDINGS: Right chest tube in stable position. Tiny right pneumothorax noted on today's exam. Persistent mild right base subsegmental atelectasis. No pleural effusion. Heart size normal. Stable mild right chest wall subcutaneous emphysema. IMPRESSION: 1. Right chest tube in stable position. Tiny right-sided pneumothorax noted on today's exam. 2.  Persistent mild right base subsegmental atelectasis. These results will be called to the ordering clinician or representative by the Radiologist Assistant, and communication documented in the PACS or zVision Dashboard. Electronically Signed   By: Marcello Moores  Register   On: 05/17/2019 09:06   Dg Chest Port 1 View  Result Date: 05/16/2019 CLINICAL DATA:  Right pneumothorax. EXAM: PORTABLE CHEST 1 VIEW COMPARISON:  05/15/2019. FINDINGS: Right chest tube in unchanged position. No pneumothorax noted on today's exam. Mild right base subsegmental atelectasis. No  pleural effusion. Heart size stable. No acute bony abnormality. Stable mild right chest wall subcutaneous emphysema. IMPRESSION: 1. Right chest tube in unchanged position. No pneumothorax noted on today's exam. Stable mild right chest wall subcutaneous emphysema. 2.  Mild right base subsegmental atelectasis. Electronically Signed   By: Marcello Moores  Register   On: 05/16/2019 07:09    Scheduled Meds: . amLODipine  5 mg Oral Daily  . enoxaparin (LOVENOX) injection  40 mg Subcutaneous Q24H  . fenofibrate  160 mg Oral Daily  . furosemide  20 mg Oral Daily  . polyethylene glycol  17 g Oral Daily  . simvastatin  20 mg Oral QPM   Continuous Infusions:  Principal Problem:   Pneumothorax Active Problems:   Hypercholesteremia   HTN (hypertension)   Hypokalemia   Atypical chest pain    Time spent: >35 minutes     Kinnie Feil  [facility] Pager [phone]. If 7PM-7AM, please contact night-coverage at [email or web address], password [facility] 05/17/2019, 1:18 PM  LOS: 5 days

## 2019-05-18 ENCOUNTER — Inpatient Hospital Stay (HOSPITAL_COMMUNITY): Payer: Medicare Other

## 2019-05-18 MED ORDER — WHITE PETROLATUM EX OINT
TOPICAL_OINTMENT | CUTANEOUS | Status: AC
  Administered 2019-05-18: 1
  Filled 2019-05-18: qty 28.35

## 2019-05-18 NOTE — Progress Notes (Signed)
Chest tube to 20 cm suction as per Dr. Montine Circle.

## 2019-05-18 NOTE — Progress Notes (Signed)
[facility] PROGRESS NOTE  Jeremy Cunningham BOF:[id number] DOB: 11-Apr-1961 DOA: 05/12/2019 PCP: Elizabeth Palau, MD (Inactive)  Brief summary   58 y.o.malewith history h/ohypertension, hyperlipidemia, DDD who smokes 1 pack/week (states he quit 4 days back) presents with complaints of worsening dyspnea for 1 day. Early morning he started experiencing retrosternal squeezing pain, started at 4/10 and worsened to 8/10 by this morning, radiating to the back, associated with dyspnea/palpitations but not associated with nausea/vomiting.  Work-up in the ED revealed complete collapse of the right lung with large right pneumothorax requiring urgent thoracostomy performed by ED physician and placement of right-sided pigtail/smallbore chest tube.   05/18/2019: Patient seen.  Nil new complaints.  Critical care team is managing chest tube.  Assessment/Plan: Right side spontaneous pneumothorax: Unclear etiology.   Alpha-1 antitrypsin 102 normal.  Repeat CXR: improved. Continue duo nebs as needed for shortness of breath.  Chest tube->water seal.  Management per PCCM.    Hypertension: Stable. Continue current medications. Echocardiogram shows 60 to 65% ejection fraction. 05/18/2019: BP today is 106/79 mmHg.  Mild hypokalemia: Resolved.  Magnesium and potassium stable.   Prolonged QTC: Resolved.  Code Status: full Family Communication: d/w patient, RN (indicate person spoken with, relationship, and if by phone, the number) Disposition Plan: remains inpatient    Consultants:  Pulmonology   Procedures:  Chest tube drain  Antibiotics: Anti-infectives (From admission, onward)   Start     Dose/Rate Route Frequency Ordered Stop   05/12/19 1515  doxycycline (VIBRAMYCIN) capsule 100 mg  Status:  Discontinued     100 mg Oral 2 times daily 05/12/19 1501 05/12/19 1514       (indicate start date, and stop date if known)  HPI/Subjective: No new complaints. No SOB No chest  pain  Objective: Vitals:   05/17/19 2002 05/18/19 0421  BP: 111/82 106/79  Pulse: 74 72  Resp: 18 16  Temp: 98.2 F (36.8 C) 98 F (36.7 C)  SpO2: 100% 100%    Intake/Output Summary (Last 24 hours) at 05/18/2019 1251 Last data filed at 05/18/2019 1003 Gross per 24 hour  Intake 960 ml  Output 365 ml  Net 595 ml   Filed Weights   05/12/19 1009  Weight: 85 kg    Exam:   General:  Not in distress   Cardiovascular: S1S2  Respiratory: Clear to auscultation   Abdomen: Soft and non tender.  Musculoskeletal: no leg edema    Data Reviewed: Basic Metabolic Panel: Recent Labs  Lab 05/12/19 1017 05/12/19 1430 05/12/19 1630 05/12/19 1818 05/13/19 0409 05/14/19 0250  NA 138  --   --   --  136 136  K 3.3*  --   --  3.5 3.4* 4.0  CL 98  --   --   --  102 104  CO2 26  --   --   --  25 25  GLUCOSE 122*  --   --   --  117* 112*  BUN 9  --   --   --  11 16  CREATININE 1.07  --   --   --  0.97 1.06  CALCIUM 11.3*  --   --   --  9.2 9.1  MG  --  1.7  --   --   --  1.9  PHOS  --   --  3.1  --   --   --    Liver Function Tests: No results for input(s): AST, ALT, ALKPHOS, BILITOT, PROT, ALBUMIN in the  last 168 hours. No results for input(s): LIPASE, AMYLASE in the last 168 hours. No results for input(s): AMMONIA in the last 168 hours. CBC: Recent Labs  Lab 05/12/19 1017 05/13/19 0409 05/14/19 0250  WBC 4.5 4.7 2.9*  HGB 16.5 14.1 13.3  HCT 50.7 42.8 40.9  MCV 93.9 93.9 95.6  PLT 329 270 256   Cardiac Enzymes: No results for input(s): CKTOTAL, CKMB, CKMBINDEX, TROPONINI in the last 168 hours. BNP (last 3 results) No results for input(s): BNP in the last 8760 hours.  ProBNP (last 3 results) No results for input(s): PROBNP in the last 8760 hours.  CBG: No results for input(s): GLUCAP in the last 168 hours.  Recent Results (from the past 240 hour(s))  SARS Coronavirus 2 (CEPHEID - Performed in [place] hospital lab), Hosp Order     Status: None   Collection  Time: 05/12/19 11:40 AM   Specimen: Nasopharyngeal Swab  Result Value Ref Range Status   SARS Coronavirus 2 NEGATIVE NEGATIVE Final    Comment: (NOTE) If result is NEGATIVE SARS-CoV-2 target nucleic acids are NOT DETECTED. The SARS-CoV-2 RNA is generally detectable in upper and lower  respiratory specimens during the acute phase of infection. The lowest  concentration of SARS-CoV-2 viral copies this assay can detect is 250  copies / mL. A negative result does not preclude SARS-CoV-2 infection  and should not be used as the sole basis for treatment or other  patient management decisions.  A negative result may occur with  improper specimen collection / handling, submission of specimen other  than nasopharyngeal swab, presence of viral mutation(s) within the  areas targeted by this assay, and inadequate number of viral copies  (<250 copies / mL). A negative result must be combined with clinical  observations, patient history, and epidemiological information. If result is POSITIVE SARS-CoV-2 target nucleic acids are DETECTED. The SARS-CoV-2 RNA is generally detectable in upper and lower  respiratory specimens dur ing the acute phase of infection.  Positive  results are indicative of active infection with SARS-CoV-2.  Clinical  correlation with patient history and other diagnostic information is  necessary to determine patient infection status.  Positive results do  not rule out bacterial infection or co-infection with other viruses. If result is PRESUMPTIVE POSTIVE SARS-CoV-2 nucleic acids MAY BE PRESENT.   A presumptive positive result was obtained on the submitted specimen  and confirmed on repeat testing.  While 2019 novel coronavirus  (SARS-CoV-2) nucleic acids may be present in the submitted sample  additional confirmatory testing may be necessary for epidemiological  and / or clinical management purposes  to differentiate between  SARS-CoV-2 and other Sarbecovirus currently known  to infect humans.  If clinically indicated additional testing with an alternate test  methodology [phone]) is advised. The SARS-CoV-2 RNA is generally  detectable in upper and lower respiratory sp ecimens during the acute  phase of infection. The expected result is Negative. Fact Sheet for Patients:  StrictlyIdeas.no Fact Sheet for Healthcare Providers: BankingDealers.co.za This test is not yet approved or cleared by the [place] FDA and has been authorized for detection and/or diagnosis of SARS-CoV-2 by FDA under an Emergency Use Authorization (EUA).  This EUA will remain in effect (meaning this test can be used) for the duration of the COVID-19 declaration under Section 564(b)(1) of the Act, 21 U.S.C. section 360bbb-3(b)(1), unless the authorization is terminated or revoked sooner. Performed at [facility], [place] [address] [place], [place] [address]  Studies: Dg Chest Port 1 View  Result Date: 05/18/2019 CLINICAL DATA:  Follow-up pneumothorax. EXAM: PORTABLE CHEST 1 VIEW COMPARISON:  Multiple previous chest x-rays. The most recent is 05/17/2019 FINDINGS: The right-sided chest tube is stable. There is a small recurrent pneumothorax estimated at 10-15%. Persistent streaky basilar atelectasis. IMPRESSION: Interval progression of right-sided pneumothorax estimated at 10-15%. Electronically Signed   By: Marijo Sanes M.D.   On: 05/18/2019 08:48   Dg Chest Port 1 View  Result Date: 05/17/2019 CLINICAL DATA:  Followup right pneumothorax and chest tube. EXAM: PORTABLE CHEST 1 VIEW COMPARISON:  05/17/2019 at 6:08 a.m. FINDINGS: The small right apical pneumothorax noted previously is smaller, with only a sliver of pneumothorax persisting at the right lateral apex. No change in the position of the right lateral hemithorax chest tube. There is persistent linear atelectasis at both lung bases. No new lung abnormalities. No convincing  pleural effusion. IMPRESSION: 1. Decreased size the right pneumothorax, now minimal. 2. No other change from the earlier exam. 3. Stable right chest tube. Mild persistent linear lung base atelectasis. Electronically Signed   By: Lajean Manes M.D.   On: 05/17/2019 17:57   Dg Chest Port 1 View  Result Date: 05/17/2019 CLINICAL DATA:  Pneumothorax.  Chest tube. EXAM: PORTABLE CHEST 1 VIEW COMPARISON:  05/16/2019. FINDINGS: Right chest tube in stable position. Tiny right pneumothorax noted on today's exam. Persistent mild right base subsegmental atelectasis. No pleural effusion. Heart size normal. Stable mild right chest wall subcutaneous emphysema. IMPRESSION: 1. Right chest tube in stable position. Tiny right-sided pneumothorax noted on today's exam. 2.  Persistent mild right base subsegmental atelectasis. These results will be called to the ordering clinician or representative by the Radiologist Assistant, and communication documented in the PACS or zVision Dashboard. Electronically Signed   By: Marcello Moores  Register   On: 05/17/2019 09:06    Scheduled Meds: . amLODipine  5 mg Oral Daily  . enoxaparin (LOVENOX) injection  40 mg Subcutaneous Q24H  . fenofibrate  160 mg Oral Daily  . furosemide  20 mg Oral Daily  . polyethylene glycol  17 g Oral Daily  . simvastatin  20 mg Oral QPM   Continuous Infusions:  Principal Problem:   Pneumothorax Active Problems:   Hypercholesteremia   HTN (hypertension)   Hypokalemia   Atypical chest pain    Time spent: 25 minutes     [place] [facility]  [facility] Pager [phone]. If 7PM-7AM, please contact night-coverage at [email or web address], password [facility] 05/18/2019, 12:51 PM  LOS: 6 days

## 2019-05-18 NOTE — Progress Notes (Signed)
NAME:  Jeremy Cunningham, MRN:  [id number], DOB:  [birth year], LOS: 6 ADMISSION DATE:  05/12/2019, CONSULTATION DATE:  7/23 REFERRING MD:  Kathrynn Humble, CHIEF COMPLAINT:  pneumothorax   Brief History   58 year old smoker admitted on 7/23 with R spontaneous pneumothorax s/p chest tube placement  History of present illness   58 year old black male with a history of tobacco abuse, hypertension, and hyperlipidemia.  Stopped smoking approximately 4 days prior to presentation.  Was in usual state of health until 7/23 when he began to notice right-sided pleuritic chest discomfort primarily with deep breath.  He denied any prodrome of nasal congestion, sore throat, sick exposure, shortness of breath or cough.  He had not been lifting heavily or had any traumatic events.  He initially went to work on 7/22 thinking the discomfort was primarily gas related chest pain, the discomfort persisted over the course of the day and through the night and worsened on 7/23 with associated shortness of breath and because of this he presented to the emergency room.  On arrival to the ER chest x-ray was obtained this demonstrated complete right sided collapse from pneumothorax.  A small bore chest tube was inserted by the emergency room physician, pulmonary was consulted to assist with chest tube management.  Progression of pneumothorax found on CXR. Evidence of air leak after starting suction. Not in respiratory distress.     Past Medical History  Hypertension, hyperlipidemia, tobacco abuse  Significant Hospital Events   7/23 pneumothorax, chest tube placed with expansion of lung post tube placement demonstrating 1 out of 7 airleak  Consults:  Pulmonary consulted 7/23  Procedures:  7/23 right-sided chest tube  Significant Diagnostic Tests:  CXR 7/24 >> Small residual right apical pneumothorax  CXR 7/28 >> Tiny right sided pneumothorax with mild right base atelectasis ECHO 7/24 >> EF 60-65%, normal RV and LV systolic  functions CXR 2/35 >> Interval progression of right-sided pneumothorax estimated at 10-15%  Micro Data:  COVID 19 negative  Antimicrobials:   NA  Interim history/subjective:  No events overnight. Patient denies SOB and CP. CXR yesterday revealed small apical R pneumothorax. CXR today reveals progression of that pneumothorax so chest tube put back on suction. Evidence of air leak after placing patient on suction.   Objective   Blood pressure 106/79, pulse 72, temperature 98 F (36.7 C), temperature source Oral, resp. rate 16, height 5\' 9"  (1.753 m), weight 85 kg, SpO2 100 %.        Intake/Output Summary (Last 24 hours) at 05/18/2019 0927 Last data filed at 05/18/2019 0600 Gross per 24 hour  Intake 840 ml  Output 365 ml  Net 475 ml   Filed Weights   05/12/19 1009  Weight: 85 kg    Examination: General: NAD. Well developed, well nourished. Comfortable in bed HENT: Normocephalic Lungs: CTAB. No increased WOB. No wheezing. Right chest tube dressing is CDI. CT assessment demonstrates good tidal. Evidence of air leak in CT Cardiovascular: RRR. S1S2. No m/r/g Abdomen: Soft, nontender, nondistented.  Extremities: Warm. 2+ distal pulses  Neuro: A&Ox4. Moves all four extremities  Resolved Hospital Problem list     Assessment & Plan:  Spontaneous Rt pneumothorax. - CXR 7/28 with tiny right sided pneumothorax, w/ persistent mild right base subsegmental atelectasis -CXR 7/29 shows progression of R-sided pneumo (estimated 10-15%) - Restart suction - Repeat CXR 7/30 AM and reassess - Continue oxygen at 2 L [place] - Continue chest tube care - OOB to Chair - IS  Hx of tobacco  abuse. - Current Every day smoker - A1AT level from 7/23 was 102 - Smoking cessation - Outpatient PFT f/u  Hx of HTN. - BP stable on regimen - On amlodipine 5 mg daily, lasix 20 mg daily at home - Continue home meds - F/u with PCP  Hx of HLD. - On fenofibrate 145 mg daily and fish oil at home -  Continue fenofibrate 160 mg PO daily - Continue simvastatin 20 mg PO daily - F/u lipid panel w/ PCP  Pain. - PRN tylenol, morphine and oxycodone  Best practice:  Diet: Regular Pain/Anxiety/Delirium protocol (if indicated): N/A VAP protocol (if indicated): N/A DVT prophylaxis: Levonox  GI prophylaxis: N/A Glucose control: N/A Mobility: oob w/ assist  Code Status: full code  Family Communication: updated by pt  Disposition: Continue 6N.   ----------------------------------------------------------------- Attending note: I have seen and examined the patient. History, labs and imaging reviewed.  58 year old smoker with spontaneous right pneumothorax status post chest tube placement Chest tube placed to waterseal today.  Chest x-ray today notes increasing pneumothorax on the right  Blood pressure 125/83, pulse 89, temperature 98.5 F (36.9 C), temperature source Oral, resp. rate 18, height 5\' 9"  (1.753 m), weight 85 kg, SpO2 100 %. Gen:      No acute distress HEENT:  EOMI, sclera anicteric Neck:     No masses; no thyromegaly Lungs:    Clear to auscultation bilaterally; normal respiratory effort CV:         Regular rate and rhythm; no murmurs Abd:      + bowel sounds; soft, non-tender; no palpable masses, no distension Ext:    No edema; adequate peripheral perfusion Skin:      Warm and dry; no rash Neuro: alert and oriented x 3 Psych: normal mood and affect   Labs/Imaging personally reviewed, significant for Chest x-ray 7/29- chest tube in stable position.  Small recurrent pneumothorax 10-15%.  Assessment/plan: Spontaneous right pneumothorax CT placed back to suction as he has small pneumothorax. Has small air leak Follow chest x-ray  History of tobacco abuse Suspected COPD We will need PFTs as an outpatient.  Marshell Garfinkel MD [facility] 05/19/2019, 3:56 PM

## 2019-05-19 ENCOUNTER — Inpatient Hospital Stay (HOSPITAL_COMMUNITY): Payer: Medicare Other

## 2019-05-19 DIAGNOSIS — I10 Essential (primary) hypertension: Secondary | ICD-10-CM

## 2019-05-19 DIAGNOSIS — F17200 Nicotine dependence, unspecified, uncomplicated: Secondary | ICD-10-CM

## 2019-05-19 MED ORDER — POTASSIUM CHLORIDE 20 MEQ PO PACK
40.0000 meq | PACK | Freq: Once | ORAL | Status: AC
  Administered 2019-05-19: 14:00:00 40 meq via ORAL
  Filled 2019-05-19: qty 2

## 2019-05-19 NOTE — Progress Notes (Signed)
NAME:  Jeremy Cunningham, MRN:  [id number], DOB:  1961/01/09, LOS: 7 ADMISSION DATE:  05/12/2019, CONSULTATION DATE:  7/23 REFERRING MD:  Kathrynn Humble, CHIEF COMPLAINT:  pneumothorax   Brief History   58 year old smoker admitted on 7/23 with R spontaneous pneumothorax s/p chest tube placement  History of present illness   58 year old black male with a history of tobacco abuse, hypertension, and hyperlipidemia.  Stopped smoking approximately 4 days prior to presentation.  Was in usual state of health until 7/23 when he began to notice right-sided pleuritic chest discomfort primarily with deep breath.  He denied any prodrome of nasal congestion, sore throat, sick exposure, shortness of breath or cough.  He had not been lifting heavily or had any traumatic events.  He initially went to work on 7/22 thinking the discomfort was primarily gas related chest pain, the discomfort persisted over the course of the day and through the night and worsened on 7/23 with associated shortness of breath and because of this he presented to the emergency room.  On arrival to the ER chest x-ray was obtained this demonstrated complete right sided collapse from pneumothorax.  A small bore chest tube was inserted by the emergency room physician, pulmonary was consulted to assist with chest tube management.  Progression of pneumothorax found on CXR. Evidence of air leak after starting suction. Not in respiratory distress.     Past Medical History  Hypertension, hyperlipidemia, tobacco abuse  Significant Hospital Events   7/23 pneumothorax, chest tube placed with expansion of lung post tube placement demonstrating 1 out of 7 airleak  Consults:  Pulmonary consulted 7/23  Procedures:  7/23 right-sided chest tube  Significant Diagnostic Tests:  CXR 7/24 >> Small residual right apical pneumothorax  CXR 7/28 >> Tiny right sided pneumothorax with mild right base atelectasis ECHO 7/24 >> EF 60-65%, normal RV and LV systolic  functions CXR 8/24 >> Interval progression of right-sided pneumothorax estimated at 10-15% CXR 7/30 >> Right apical pneumothorax smaller compared to 1 day prior. No tension component. Micro Data:  COVID 19 negative  Antimicrobials:   NA  Interim history/subjective:  No events overnight. Patient denies SOB and CP. Normal sats on room air. CXR this AM shows improvement in R apical pneumothorax. Still evidence of air leak in chest tube.  Objective   Blood pressure 131/86, pulse 70, temperature 98 F (36.7 C), temperature source Oral, resp. rate 18, height 5\' 9"  (1.753 m), weight 85 kg, SpO2 100 %.        Intake/Output Summary (Last 24 hours) at 05/19/2019 0900 Last data filed at 05/19/2019 0700 Gross per 24 hour  Intake 780 ml  Output 1916 ml  Net -1136 ml   Filed Weights   05/12/19 1009  Weight: 85 kg    Examination: General: NAD. Well developed, well nourished. Comfortable in bed HENT: Normocephalic Lungs: CTAB. No increased WOB. No wheezing. Right chest tube dressing is CDI. CT assessment demonstrates good tidal. Evidence of air leak in CT Cardiovascular: RRR. S1S2. No m/r/g Abdomen: Soft, nontender, nondistented.  Extremities: Warm. 2+ distal pulses  Neuro: A&Ox4. Moves all four extremities  Resolved Hospital Problem list     Assessment & Plan:  Spontaneous Rt pneumothorax. - CXR 7/28 with tiny right sided pneumothorax, f/u CXR 7/29 showed progression of R-sided     pneumo (estimated 10-15%), suction restarted 7/29 - CXR 7/30 shows that R apical pneumothorax smaller compared to 1 day prior. No    tension component. - Repeat CXR 7/31 AM and  reassess - Report unintentional 30 lbs weight loss - Consider CT chest to assess for possible lung malignancy s/p CT removal - Continue chest tube care - OOB to Chair - IS  Hx of tobacco abuse. - Current Every day smoker (1/2 ppd) - A1AT level from 7/23 was 102 - Smoking cessation - Outpatient PFT f/u  Hx of HTN. - BP  stable on regimen - On amlodipine 5 mg daily, lasix 20 mg daily at home - Continue home meds - F/u with PCP  Hx of HLD. - On fenofibrate 145 mg daily and fish oil at home - Continue fenofibrate 160 mg PO daily - Continue simvastatin 20 mg PO daily - F/u lipid panel w/ PCP  Pain. - PRN tylenol, morphine and oxycodone  Best practice:  Diet: Regular Pain/Anxiety/Delirium protocol (if indicated): N/A VAP protocol (if indicated): N/A DVT prophylaxis: Levonox  GI prophylaxis: N/A Glucose control: N/A Mobility: oob w/ assist  Code Status: full code  Family Communication: updated by pt  Disposition: Continue 6N.

## 2019-05-19 NOTE — Progress Notes (Signed)
PCCM Progress Note  Name: Jeremy Cunningham  DOB: 1961/10/12 MRN: [id number] Admission date: 05/12/2019 Consult date: 05/13/2019 Referring provider: Dr. Kathrynn Humble, ER CC: Pleuritic Rt chest pain  Brief Description:  58 yo male smoker developed sudden onset of Rt sided pleuritic chest pain.  Found to have large Rt PTX on CXR and had chest tube placed 7/23 by EDP.  COVID and HIV negative from 05/12/19.  Subjective:  Straw colored R chest tube drainage Air leak evident NAD on RA  Vital Signs:  BP 131/86 (BP Location: Right Arm)   Pulse 70   Temp 98 F (36.7 C) (Oral)   Resp 18   Ht 5\' 9"  (1.753 m)   Wt 85 kg   SpO2 100%   BMI 27.67 kg/m   Physical Exam:   General:  Adult male, reclined in bed NAD on RA  HEENT: NCAT pink mm, trachea midline, anicteric sclera  Neuro: AAO. Following commands.  CV: s1s2 capillary refill < 3 seconds  PULM:  CTA. Symmetrical chest expansion. No accessory muscle use  GI: soft, round, ndnt. + bowel sounds  Extremities: Symmetrical bulk and tone, no obvious deformity, no cyanosis  Skin: Clean, dry, warm, without rash   Labs:   CMP Latest Ref Rng & Units 05/14/2019 05/13/2019 05/12/2019  Glucose 70 - 99 mg/dL 112(H) 117(H) -  BUN 6 - 20 mg/dL 16 11 -  Creatinine 0.61 - 1.24 mg/dL 1.06 0.97 -  Sodium 135 - 145 mmol/L 136 136 -  Potassium 3.5 - 5.1 mmol/L 4.0 3.4(L) 3.5  Chloride 98 - 111 mmol/L 104 102 -  CO2 22 - 32 mmol/L 25 25 -  Calcium 8.9 - 10.3 mg/dL 9.1 9.2 -   CBC Latest Ref Rng & Units 05/14/2019 05/13/2019 05/12/2019  WBC 4.0 - 10.5 K/uL 2.9(L) 4.7 4.5  Hemoglobin 13.0 - 17.0 g/dL 13.3 14.1 16.5  Hematocrit 39.0 - 52.0 % 40.9 42.8 50.7  Platelets 150 - 400 K/uL 256 270 329    Assessment/Plan:   Spontaneous Rt pneumothorax. Suction discontinued 7/27>> to Water seal 7/29 Chest tube placed back to suction (-20)  -CXR (7/30) reviewed, shows interval decrease in pneumothorax. No edema or consolidation.   -Persistent air leak (7/30) -Of note,  patient with unintentional 30 lb weight loss.  P CT chest  Chest Tube to water seal Chest tube care IS, mobilize  AM CXR   Hx of tobacco abuse. -A1AT 102 (7/23) -1/2 ppd smoker P -smoking cessation -will need follow up OP for PFTs, follow up   Hx of HTN, HLD. - per primary   Eliseo Gum MSN, AGACNP-BC [place] [phone] If no answer, [phone] 05/19/2019, 10:00 AM

## 2019-05-19 NOTE — Progress Notes (Addendum)
PROGRESS NOTE    Jeremy Cunningham  ACZ:[id number]  DOB: 1960-11-08  DOA: 05/12/2019 PCP: Elizabeth Palau, MD (Inactive)  Brief Narrative:  58 y.o.malewith history h/ohypertension, hyperlipidemia, DDD who smokes 1 pack/week (states he quit 4 days back) presents with complaints of worsening dyspnea for 1 day. Early morning he started experiencing retrosternal squeezing pain, started at 4/10 and worsened to 8/10 by this morning, radiating to the back, associated with dyspnea/palpitations but not associated with nausea/vomiting.  Work-up in the ED revealed complete collapse of the right lung with large right pneumothorax requiring urgent thoracostomy performed by ED physician and placement of right-sided pigtail/smallbore chest tube.   Subjective:  Patient has right-sided chest tube to waterseal.  Denies any acute complaints.  Objective: Vitals:   05/18/19 0421 05/18/19 1348 05/18/19 2139 05/19/19 0557  BP: 106/79 108/76 116/89 131/86  Pulse: 72 85 75 70  Resp: 16 18 18 18   Temp: 98 F (36.7 C) 98.7 F (37.1 C) 98.4 F (36.9 C) 98 F (36.7 C)  TempSrc: Oral Oral Oral Oral  SpO2: 100% 97% 100% 100%  Weight:      Height:        Intake/Output Summary (Last 24 hours) at 05/19/2019 1149 Last data filed at 05/19/2019 1045 Gross per 24 hour  Intake 1020 ml  Output 2116 ml  Net -1096 ml   Filed Weights   05/12/19 1009  Weight: 85 kg    Physical Examination:  General exam: Appears calm and comfortable  Respiratory system: Clear to auscultation. Respiratory effort normal.  Right-sided chest tube to waterseal Cardiovascular system: S1 & S2 heard, RRR. No JVD, murmurs, rubs, gallops or clicks. No pedal edema. Gastrointestinal system: Abdomen is nondistended, soft and nontender. No organomegaly or masses felt. Normal bowel sounds heard. Central nervous system: Alert and oriented. No focal neurological deficits. Extremities: Symmetric 5 x 5 power. Skin: No rashes, lesions or  ulcers Psychiatry: Judgement and insight appear normal. Mood & affect appropriate.     Data Reviewed: I have personally reviewed following labs and imaging studies  CBC: Recent Labs  Lab 05/13/19 0409 05/14/19 0250  WBC 4.7 2.9*  HGB 14.1 13.3  HCT 42.8 40.9  MCV 93.9 95.6  PLT 270 109   Basic Metabolic Panel: Recent Labs  Lab 05/12/19 1430 05/12/19 1630 05/12/19 1818 05/13/19 0409 05/14/19 0250  NA  --   --   --  136 136  K  --   --  3.5 3.4* 4.0  CL  --   --   --  102 104  CO2  --   --   --  25 25  GLUCOSE  --   --   --  117* 112*  BUN  --   --   --  11 16  CREATININE  --   --   --  0.97 1.06  CALCIUM  --   --   --  9.2 9.1  MG 1.7  --   --   --  1.9  PHOS  --  3.1  --   --   --    GFR: Estimated Creatinine Clearance: 84.1 mL/min (by C-G formula based on SCr of 1.06 mg/dL). Liver Function Tests: No results for input(s): AST, ALT, ALKPHOS, BILITOT, PROT, ALBUMIN in the last 168 hours. No results for input(s): LIPASE, AMYLASE in the last 168 hours. No results for input(s): AMMONIA in the last 168 hours. Coagulation Profile: No results for input(s): INR, PROTIME in the last 168  hours. Cardiac Enzymes: No results for input(s): CKTOTAL, CKMB, CKMBINDEX, TROPONINI in the last 168 hours. BNP (last 3 results) No results for input(s): PROBNP in the last 8760 hours. HbA1C: No results for input(s): HGBA1C in the last 72 hours. CBG: No results for input(s): GLUCAP in the last 168 hours. Lipid Profile: No results for input(s): CHOL, HDL, LDLCALC, TRIG, CHOLHDL, LDLDIRECT in the last 72 hours. Thyroid Function Tests: No results for input(s): TSH, T4TOTAL, FREET4, T3FREE, THYROIDAB in the last 72 hours. Anemia Panel: No results for input(s): VITAMINB12, FOLATE, FERRITIN, TIBC, IRON, RETICCTPCT in the last 72 hours. Sepsis Labs: No results for input(s): PROCALCITON, LATICACIDVEN in the last 168 hours.  Recent Results (from the past 240 hour(s))  SARS Coronavirus 2  (CEPHEID - Performed in [place] hospital lab), Hosp Order     Status: None   Collection Time: 05/12/19 11:40 AM   Specimen: Nasopharyngeal Swab  Result Value Ref Range Status   SARS Coronavirus 2 NEGATIVE NEGATIVE Final    Comment: (NOTE) If result is NEGATIVE SARS-CoV-2 target nucleic acids are NOT DETECTED. The SARS-CoV-2 RNA is generally detectable in upper and lower  respiratory specimens during the acute phase of infection. The lowest  concentration of SARS-CoV-2 viral copies this assay can detect is 250  copies / mL. A negative result does not preclude SARS-CoV-2 infection  and should not be used as the sole basis for treatment or other  patient management decisions.  A negative result may occur with  improper specimen collection / handling, submission of specimen other  than nasopharyngeal swab, presence of viral mutation(s) within the  areas targeted by this assay, and inadequate number of viral copies  (<250 copies / mL). A negative result must be combined with clinical  observations, patient history, and epidemiological information. If result is POSITIVE SARS-CoV-2 target nucleic acids are DETECTED. The SARS-CoV-2 RNA is generally detectable in upper and lower  respiratory specimens dur ing the acute phase of infection.  Positive  results are indicative of active infection with SARS-CoV-2.  Clinical  correlation with patient history and other diagnostic information is  necessary to determine patient infection status.  Positive results do  not rule out bacterial infection or co-infection with other viruses. If result is PRESUMPTIVE POSTIVE SARS-CoV-2 nucleic acids MAY BE PRESENT.   A presumptive positive result was obtained on the submitted specimen  and confirmed on repeat testing.  While 2019 novel coronavirus  (SARS-CoV-2) nucleic acids may be present in the submitted sample  additional confirmatory testing may be necessary for epidemiological  and / or clinical  management purposes  to differentiate between  SARS-CoV-2 and other Sarbecovirus currently known to infect humans.  If clinically indicated additional testing with an alternate test  methodology [phone]) is advised. The SARS-CoV-2 RNA is generally  detectable in upper and lower respiratory sp ecimens during the acute  phase of infection. The expected result is Negative. Fact Sheet for Patients:  StrictlyIdeas.no Fact Sheet for Healthcare Providers: BankingDealers.co.za This test is not yet approved or cleared by the [place] FDA and has been authorized for detection and/or diagnosis of SARS-CoV-2 by FDA under an Emergency Use Authorization (EUA).  This EUA will remain in effect (meaning this test can be used) for the duration of the COVID-19 declaration under Section 564(b)(1) of the Act, 21 U.S.C. section 360bbb-3(b)(1), unless the authorization is terminated or revoked sooner. Performed at [facility], [place] [address] [place], [place] [address]  Radiology Studies: Dg Chest Port 1 View  Result Date: 05/19/2019 CLINICAL DATA:  Recent pneumothorax with chest tube present EXAM: PORTABLE CHEST 1 VIEW COMPARISON:  May 18, 2019 FINDINGS: Chest tube remains on the right peripherally. Right apical region pneumothorax is smaller compared to 1 day prior. No tension component. No edema or consolidation. Heart size and pulmonary vascular normal. No adenopathy. No bone lesions. IMPRESSION: Right apical pneumothorax smaller compared to 1 day prior. No tension component. Chest tube position unchanged. No edema or consolidation. Stable cardiac silhouette. Electronically Signed   By: Lowella Grip III M.D.   On: 05/19/2019 09:36   Dg Chest Port 1 View  Result Date: 05/18/2019 CLINICAL DATA:  Follow-up pneumothorax. EXAM: PORTABLE CHEST 1 VIEW COMPARISON:  Multiple previous chest x-rays. The most recent is 05/17/2019 FINDINGS: The  right-sided chest tube is stable. There is a small recurrent pneumothorax estimated at 10-15%. Persistent streaky basilar atelectasis. IMPRESSION: Interval progression of right-sided pneumothorax estimated at 10-15%. Electronically Signed   By: Marijo Sanes M.D.   On: 05/18/2019 08:48   Dg Chest Port 1 View  Result Date: 05/17/2019 CLINICAL DATA:  Followup right pneumothorax and chest tube. EXAM: PORTABLE CHEST 1 VIEW COMPARISON:  05/17/2019 at 6:08 a.m. FINDINGS: The small right apical pneumothorax noted previously is smaller, with only a sliver of pneumothorax persisting at the right lateral apex. No change in the position of the right lateral hemithorax chest tube. There is persistent linear atelectasis at both lung bases. No new lung abnormalities. No convincing pleural effusion. IMPRESSION: 1. Decreased size the right pneumothorax, now minimal. 2. No other change from the earlier exam. 3. Stable right chest tube. Mild persistent linear lung base atelectasis. Electronically Signed   By: Lajean Manes M.D.   On: 05/17/2019 17:57        Scheduled Meds: . amLODipine  5 mg Oral Daily  . enoxaparin (LOVENOX) injection  40 mg Subcutaneous Q24H  . fenofibrate  160 mg Oral Daily  . furosemide  20 mg Oral Daily  . polyethylene glycol  17 g Oral Daily  . simvastatin  20 mg Oral QPM   Continuous Infusions:  Assessment & Plan:    1.  Spontaneous right-sided pneumothorax: Appreciate pulmonary evaluation and assistance with chest tube management.  Patient does have a history of smoking and may have underlying emphysema.  CT chest per pulmonary (?weight loss h/o).  2.  Hypertension: On Norvasc. Patient states he takes Lasix for hypertension but denies any history of CHF/leg swellings.  Given concern for hypokalemia/prolonged QTC and no clear indication for diuretics, will DC Lasix.  Can substitute with alternate medications for hypertension as needed.  3.  Atypical chest pain: Present on admission  secondary to problem #1.  Now resolved.  Cardiac enzymes negative.  EKG/echo as below.  4.  Abnormal EKG: EKG on admission showed LVH pattern with nonspecific ST-T changes and prolonged QTC at 565 ms.  Echocardiogram revealed moderate basal septal hypertrophy, impaired LV relaxation and EF of 60 to 65%.  Repeat EKG on 7/27 showed improvement in QTC to 425 ms.  Replace potassium and keep close to 4.  Keep magnesium close to 2.  5. Hyperlipidemia: Statins/fenofibrate  6.  Tobacco use: Counseled to quit.  Will need PFTs at some point.  MDI as needed  DVT prophylaxis: Lovenox Code Status: Full code Family / Patient Communication: Discussed with patient and discussed with pulmonary medical student Disposition Plan: Home when cleared by CT surgery and off chest tube  LOS: 7 days    Time spent:     Guilford Shi, MD [facility] Pager [phone]  If 7PM-7AM, please contact night-coverage [email or web address] Password [email or web address] 05/19/2019, 11:49 AM

## 2019-05-19 NOTE — Progress Notes (Signed)
CT scan reviewed, nothing very concerning. Will check in x-ray in the morning and do a clamping trial. Please leave chest tube to water seal.

## 2019-05-19 NOTE — Progress Notes (Signed)
Chest tube flushed with 30 cc normal saline.

## 2019-05-20 ENCOUNTER — Inpatient Hospital Stay (HOSPITAL_COMMUNITY): Payer: Medicare Other

## 2019-05-20 ENCOUNTER — Other Ambulatory Visit: Payer: Self-pay | Admitting: Internal Medicine

## 2019-05-20 DIAGNOSIS — R0789 Other chest pain: Secondary | ICD-10-CM

## 2019-05-20 DIAGNOSIS — J939 Pneumothorax, unspecified: Secondary | ICD-10-CM

## 2019-05-20 MED ORDER — OXYCODONE HCL 5 MG PO TABS: 5.0000 mg | ORAL_TABLET | Freq: Four times a day (QID) | ORAL | 0 refills | Status: AC | PRN

## 2019-05-20 MED ORDER — COMBIVENT RESPIMAT 20-100 MCG/ACT IN AERS: 1.0000 | INHALATION_SPRAY | Freq: Four times a day (QID) | RESPIRATORY_TRACT | 1 refills | Status: DC | PRN

## 2019-05-20 NOTE — Progress Notes (Addendum)
PCCM Progress Note  Name: Jeremy Cunningham  DOB: Oct 15, 1961 MRN: [id number] Admission date: 05/12/2019 Consult date: 05/13/2019 Referring provider: Dr. Kathrynn Humble, ER CC: Pleuritic Rt chest pain  Brief Description:  58 yo male smoker developed sudden onset of Rt sided pleuritic chest pain.  Found to have large Rt PTX on CXR and had chest tube placed 7/23 by EDP.  COVID and HIV negative from 05/12/19.  Subjective:  No events, feels well.  Chest pain controlled. No fever, chills, N/V.  Vital Signs:  BP 116/81 (BP Location: Right Arm)   Pulse 72   Temp 97.6 F (36.4 C) (Oral)   Resp 18   Ht 5\' 9"  (1.753 m)   Wt 85 kg   SpO2 100%   BMI 27.67 kg/m   Physical Exam:   GEN: middle aged man in NAD HEENT: MMM, trachea midline CV: RRR, ext warm PULM: clear bilaterally, no accessory muscle use, minimal air leak GI: soft, +BS EXT: no edema NEURO: moves all 4 ext to command PSYCH: RASS 0 SKIN: no rashes   Labs:   CMP Latest Ref Rng & Units 05/14/2019 05/13/2019 05/12/2019  Glucose 70 - 99 mg/dL 112(H) 117(H) -  BUN 6 - 20 mg/dL 16 11 -  Creatinine 0.61 - 1.24 mg/dL 1.06 0.97 -  Sodium 135 - 145 mmol/L 136 136 -  Potassium 3.5 - 5.1 mmol/L 4.0 3.4(L) 3.5  Chloride 98 - 111 mmol/L 104 102 -  CO2 22 - 32 mmol/L 25 25 -  Calcium 8.9 - 10.3 mg/dL 9.1 9.2 -   CBC Latest Ref Rng & Units 05/14/2019 05/13/2019 05/12/2019  WBC 4.0 - 10.5 K/uL 2.9(L) 4.7 4.5  Hemoglobin 13.0 - 17.0 g/dL 13.3 14.1 16.5  Hematocrit 39.0 - 52.0 % 40.9 42.8 50.7  Platelets 150 - 400 K/uL 256 270 329    Assessment/Plan:  Primary spontaneous PTX- blebs on CT, no signs of cancer - Clamp trial today - If CXR at [facility] looks okay, will remove chest tube - If has recurrence, should have VATS with blebectomy and pleurodesis - Should avoid air travel for foreseeable future - Can go home today if chest tube comes out - Will arrange f/u in clinic in 2 weeks to get PFTs and discuss smoking cessation methods  Erskine Emery  MD

## 2019-05-20 NOTE — Discharge Summary (Addendum)
Physician Discharge Summary  Jeremy Cunningham MGQ:[id number] DOB: [birth year] DOA: 05/12/2019  PCP: Elizabeth Palau, MD (Inactive)  Admit date: 05/12/2019 Discharge date: 05/20/2019 Consultations: Pulmonary Dr Tamala Julian Admitted From: Home Disposition: home  Discharge Diagnoses:  Principal Problem:   Pneumothorax Active Problems:   Hypercholesteremia   HTN (hypertension)   Hypokalemia   Atypical chest pain   Hospital Course Summary: 58 y.o.malewith history h/ohypertension, hyperlipidemia, DDD who smokes 1 pack/week ( quit 4 days PTA) presented to the ED on 7/23 with complaints of worsening dyspnea for 1 day and chst pain that started the morning of admission. He started experiencing retrosternal squeezing pain, started at 4/10 and worsened to 8/10 by this morning, radiating to the back, associated with dyspnea/palpitations but not associated with nausea/vomiting. Work-up in the ED revealed complete collapse of the right lung with large right pneumothorax requiring urgent thoracostomy performed by ED physician and placement of right-sided pigtail/smallbore chest tube. Patient subsequently admitted to Hospitalist service while pulmonary managed the chest tube.   1.  Spontaneous right-sided pneumothorax: Appreciate pulmonary evaluation and assistance with chest tube management.  Patient does have a history of smoking and CT chest shows underlying emphysema/bullous changes in left upperlobe.Chest tube was placed to waterseal overnight and repeat CXR shows improvement today. Chest tube removed by pulmonary this afternoon and cleared for discharge. They recommend outpatient f/u in 2 weeks for PFTs. [place] f/u PCP before cleared for work and advised to  Avoid air travel in foreseeable future.   2.  Hypertension: On Norvasc.Patient states he takes Lasix for hypertension but denies any history of CHF/leg swellings.  Given concern for hypokalemia/prolonged QTC and no clear indication for diuretics,  discontinued Lasix.  Can substitute with alternate medications for hypertension as needed but BP remained stable with <systolic 267 while here on lone therapy with Norvasc.  3.  Atypical chest pain: Present on admission secondary to problem #1.  Now resolved.  Cardiac enzymes negative.  EKG/echo as below.  4. Abnormal EKG: EKG on admission showed LVH pattern with nonspecific ST-T changes and prolonged QTC at 565 ms.  Echocardiogram revealed moderate basal septal hypertrophy, impaired LV relaxation and EF of 60 to 65%.  Repeat EKG on 7/27 showed improvement in QTC to 425 ms.  Replaced potassium to keep close to 4.  Magnesium close to 2.  5. Hyperlipidemia:  Resume home meds. Patient not sure if still taking Zocor. On fish oil/ fenofibrate  6.  Tobacco use: Counseled to quit.  PFTs in 2 weeks.  MDI as needed for home use  Discharge Exam:  Vitals:   05/20/19 0512 05/20/19 1440  BP: 116/81 122/83  Pulse: 72 97  Resp: 18 18  Temp: 97.6 F (36.4 C) 98.5 F (36.9 C)  SpO2: 100% 98%   Vitals:   05/19/19 1513 05/19/19 2046 05/20/19 0512 05/20/19 1440  BP: 125/83 114/77 116/81 122/83  Pulse: 89 79 72 97  Resp: 18 16 18 18   Temp: 98.5 F (36.9 C) 98.5 F (36.9 C) 97.6 F (36.4 C) 98.5 F (36.9 C)  TempSrc: Oral Oral Oral Oral  SpO2: 100% 96% 100% 98%  Weight:      Height:        General: Pt is alert, awake, not in acute distress Cardiovascular: RRR, S1/S2 +, no rubs, no gallops Respiratory: CTA bilaterally, no wheezing, no rhonchi Abdominal: Soft, NT, ND, bowel sounds + Extremities: no edema, no cyanosis  Discharge Condition:Stable CODE STATUS: Full code Diet recommendation: low salt diet Recommendations for  Outpatient Follow-up:  1. Follow up with PCP: 1 week 2. Follow up with consultants: Pulmonary clinic /PFTs in 2 weeks 3. Please obtain follow up labs including: BMP/ [place] services upon discharge:  Equipment/Devices upon discharge:   Discharge  Instructions:  Discharge Instructions    Call MD for:  difficulty breathing, headache or visual disturbances   Complete by: As directed    Call MD for:  persistant dizziness or light-headedness   Complete by: As directed    Call MD for:  redness, tenderness, or signs of infection (pain, swelling, redness, odor or green/yellow discharge around incision site)   Complete by: As directed    Call MD for:  severe uncontrolled pain   Complete by: As directed    Call MD for:  temperature >100.4   Complete by: As directed    Diet - low sodium heart healthy   Complete by: As directed    Discharge instructions   Complete by: As directed    Other Restrictions   Complete by: As directed    Avoid heavy exertion, stay out of work until seen and cleared by PCP, avoid air travel for foreseeable future     Allergies as of 05/20/2019   No Known Allergies     Medication List    STOP taking these medications   furosemide 20 MG tablet Commonly known as: LASIX     TAKE these medications   amLODipine 5 MG tablet Commonly known as: NORVASC Take 5 mg by mouth daily.   Combivent Respimat 20-100 MCG/ACT Aers respimat Generic drug: Ipratropium-Albuterol Inhale 1 puff into the lungs every 6 (six) hours as needed for wheezing or shortness of breath.   fenofibrate 145 MG tablet Commonly known as: TRICOR Take 145 mg by mouth daily.   FISH OIL PO Take 1 tablet by mouth daily.   oxyCODONE 5 MG immediate release tablet Commonly known as: Oxy IR/ROXICODONE Take 1 tablet (5 mg total) by mouth every 6 (six) hours as needed for up to 3 days for moderate pain or severe pain.       No Known Allergies    The results of significant diagnostics from this hospitalization (including imaging, microbiology, ancillary and laboratory) are listed below for reference.    Labs: BNP (last 3 results) No results for input(s): BNP in the last 8760 hours. Basic Metabolic Panel: Recent Labs  Lab 05/14/19 0250   NA 136  K 4.0  CL 104  CO2 25  GLUCOSE 112*  BUN 16  CREATININE 1.06  CALCIUM 9.1  MG 1.9   Liver Function Tests: No results for input(s): AST, ALT, ALKPHOS, BILITOT, PROT, ALBUMIN in the last 168 hours. No results for input(s): LIPASE, AMYLASE in the last 168 hours. No results for input(s): AMMONIA in the last 168 hours. CBC: Recent Labs  Lab 05/14/19 0250  WBC 2.9*  HGB 13.3  HCT 40.9  MCV 95.6  PLT 256   Cardiac Enzymes: No results for input(s): CKTOTAL, CKMB, CKMBINDEX, TROPONINI in the last 168 hours. BNP: Invalid input(s): POCBNP CBG: No results for input(s): GLUCAP in the last 168 hours. D-Dimer No results for input(s): DDIMER in the last 72 hours. Hgb A1c No results for input(s): HGBA1C in the last 72 hours. Lipid Profile No results for input(s): CHOL, HDL, LDLCALC, TRIG, CHOLHDL, LDLDIRECT in the last 72 hours. Thyroid function studies No results for input(s): TSH, T4TOTAL, T3FREE, THYROIDAB in the last 72 hours.  Invalid input(s): FREET3 Anemia work up  No results for input(s): VITAMINB12, FOLATE, FERRITIN, TIBC, IRON, RETICCTPCT in the last 72 hours. Urinalysis    Component Value Date/Time   COLORURINE YELLOW 03/14/2010 0820   APPEARANCEUR CLEAR 03/14/2010 0820   LABSPEC 1.015 03/14/2010 0820   PHURINE 5.0 03/14/2010 0820   GLUCOSEU NEGATIVE 03/14/2010 0820   HGBUR NEGATIVE 03/14/2010 0820   BILIRUBINUR NEGATIVE 03/14/2010 0820   KETONESUR NEGATIVE 03/14/2010 0820   PROTEINUR NEGATIVE 03/14/2010 0820   UROBILINOGEN 0.2 03/14/2010 0820   NITRITE NEGATIVE 03/14/2010 0820   LEUKOCYTESUR SMALL (A) 03/14/2010 0820   Sepsis Labs Invalid input(s): PROCALCITONIN,  WBC,  LACTICIDVEN Microbiology Recent Results (from the past 240 hour(s))  SARS Coronavirus 2 (CEPHEID - Performed in [place] hospital lab), Hosp Order     Status: None   Collection Time: 05/12/19 11:40 AM   Specimen: Nasopharyngeal Swab  Result Value Ref Range Status   SARS  Coronavirus 2 NEGATIVE NEGATIVE Final    Comment: (NOTE) If result is NEGATIVE SARS-CoV-2 target nucleic acids are NOT DETECTED. The SARS-CoV-2 RNA is generally detectable in upper and lower  respiratory specimens during the acute phase of infection. The lowest  concentration of SARS-CoV-2 viral copies this assay can detect is 250  copies / mL. A negative result does not preclude SARS-CoV-2 infection  and should not be used as the sole basis for treatment or other  patient management decisions.  A negative result may occur with  improper specimen collection / handling, submission of specimen other  than nasopharyngeal swab, presence of viral mutation(s) within the  areas targeted by this assay, and inadequate number of viral copies  (<250 copies / mL). A negative result must be combined with clinical  observations, patient history, and epidemiological information. If result is POSITIVE SARS-CoV-2 target nucleic acids are DETECTED. The SARS-CoV-2 RNA is generally detectable in upper and lower  respiratory specimens dur ing the acute phase of infection.  Positive  results are indicative of active infection with SARS-CoV-2.  Clinical  correlation with patient history and other diagnostic information is  necessary to determine patient infection status.  Positive results do  not rule out bacterial infection or co-infection with other viruses. If result is PRESUMPTIVE POSTIVE SARS-CoV-2 nucleic acids MAY BE PRESENT.   A presumptive positive result was obtained on the submitted specimen  and confirmed on repeat testing.  While 2019 novel coronavirus  (SARS-CoV-2) nucleic acids may be present in the submitted sample  additional confirmatory testing may be necessary for epidemiological  and / or clinical management purposes  to differentiate between  SARS-CoV-2 and other Sarbecovirus currently known to infect humans.  If clinically indicated additional testing with an alternate test   methodology [phone]) is advised. The SARS-CoV-2 RNA is generally  detectable in upper and lower respiratory sp ecimens during the acute  phase of infection. The expected result is Negative. Fact Sheet for Patients:  StrictlyIdeas.no Fact Sheet for Healthcare Providers: BankingDealers.co.za This test is not yet approved or cleared by the [place] FDA and has been authorized for detection and/or diagnosis of SARS-CoV-2 by FDA under an Emergency Use Authorization (EUA).  This EUA will remain in effect (meaning this test can be used) for the duration of the COVID-19 declaration under Section 564(b)(1) of the Act, 21 U.S.C. section 360bbb-3(b)(1), unless the authorization is terminated or revoked sooner. Performed at [facility], [place] [address], [place], [place] [address]     Procedures/Studies: Dg Chest 1 View  Result Date: 05/20/2019 CLINICAL DATA:  Pneumothorax EXAM: CHEST  1 VIEW COMPARISON:  05/20/2019, 6:05 a.m. FINDINGS: Unchanged AP portable examination with a pigtail chest tube about the right lateral pleural space and almost imperceptible, tiny right apical pneumothorax. The remainder of the lungs are normally aerated. The heart and mediastinum are unremarkable. IMPRESSION: Unchanged AP portable examination with a pigtail chest tube about the right lateral pleural space and almost imperceptible, tiny right apical pneumothorax. The remainder of the lungs are normally aerated. The heart and mediastinum are unremarkable. Electronically Signed   By: Eddie Candle M.D.   On: 05/20/2019 14:57   Dg Chest 2 View  Result Date: 05/12/2019 CLINICAL DATA:  Chest pain, shortness of breath EXAM: CHEST - 2 VIEW COMPARISON:  09/13/2012 FINDINGS: Complete collapse of the right lung with large right pneumothorax. No evidence of tension. Heart is normal size. Possible nodule at the base of the left lung. Otherwise left lung clear. No effusions.  No acute bony abnormality. IMPRESSION: Complete collapse of the right lung with near large right pneumothorax. Question small nodule at the left lung base. This could be further evaluated with elective chest CT when patient is able. These results were called by telephone at the time of interpretation on 05/12/2019 at 10:52 am to Alyson Reedy, who verbally acknowledged these results. Electronically Signed   By: Rolm Baptise M.D.   On: 05/12/2019 10:55   Ct Chest Wo Contrast  Result Date: 05/19/2019 CLINICAL DATA:  Pneumothorax follow-up EXAM: CT CHEST WITHOUT CONTRAST TECHNIQUE: Multidetector CT imaging of the chest was performed following the standard protocol without IV contrast. COMPARISON:  Chest x-ray May 19, 2019, chest x-ray May 18, 2019 FINDINGS: Cardiovascular: No significant vascular findings. Normal heart size. No pericardial effusion. Mediastinum/Nodes: No enlarged mediastinal or axillary lymph nodes. Thyroid gland, trachea, and esophagus demonstrate no significant findings. Lungs/Pleura: Right chest tube is identified unchanged compared prior exam. Minimal medial right apical pneumothorax is identified significantly smaller compared to prior chest x-ray of May 18, 2019. A tiny sliver of air is identified in the pleural space laterally at the entrance of the right chest tube. Minimal sliver air is identified in the medial posterior right pleural space at the level of the mid chest. Bullous change of the medial left upper lobe is identified. There is atelectasis of the posterior right lung base with a small right pleural effusion. 2 mm calcified nodule is identified in the right middle lobe image 94 series 4 Upper Abdomen: No acute abnormality. Musculoskeletal: No acute abnormality. IMPRESSION: Right chest tube is identified. There is minimal right pneumothorax as described significantly smaller compared to prior chest x-ray of May 18, 2019. Atelectasis of the right lung base with associated small  right pleural effusion. Electronically Signed   By: Abelardo Diesel M.D.   On: 05/19/2019 12:51   Dg Chest Port 1 View  Result Date: 05/20/2019 CLINICAL DATA:  Pneumothorax.  Chest tube. EXAM: PORTABLE CHEST 1 VIEW COMPARISON:  CT chest 05/19/2019.  Chest x-ray 05/19/2019. FINDINGS: Mediastinum hilar structures normal. Mild right base subsegmental atelectasis. Right chest tube in stable position. Tiny right apical pneumothorax is almost completely cleared. IMPRESSION: Right chest tube in stable position. Tiny right apical pneumothorax has almost completely cleared. Electronically Signed   By: Marcello Moores  Register   On: 05/20/2019 07:48   Dg Chest Port 1 View  Result Date: 05/19/2019 CLINICAL DATA:  Recent pneumothorax with chest tube present EXAM: PORTABLE CHEST 1 VIEW COMPARISON:  May 18, 2019 FINDINGS: Chest tube remains on the right  peripherally. Right apical region pneumothorax is smaller compared to 1 day prior. No tension component. No edema or consolidation. Heart size and pulmonary vascular normal. No adenopathy. No bone lesions. IMPRESSION: Right apical pneumothorax smaller compared to 1 day prior. No tension component. Chest tube position unchanged. No edema or consolidation. Stable cardiac silhouette. Electronically Signed   By: Lowella Grip III M.D.   On: 05/19/2019 09:36   Dg Chest Port 1 View  Result Date: 05/18/2019 CLINICAL DATA:  Follow-up pneumothorax. EXAM: PORTABLE CHEST 1 VIEW COMPARISON:  Multiple previous chest x-rays. The most recent is 05/17/2019 FINDINGS: The right-sided chest tube is stable. There is a small recurrent pneumothorax estimated at 10-15%. Persistent streaky basilar atelectasis. IMPRESSION: Interval progression of right-sided pneumothorax estimated at 10-15%. Electronically Signed   By: Marijo Sanes M.D.   On: 05/18/2019 08:48   Dg Chest Port 1 View  Result Date: 05/17/2019 CLINICAL DATA:  Followup right pneumothorax and chest tube. EXAM: PORTABLE CHEST 1 VIEW  COMPARISON:  05/17/2019 at 6:08 a.m. FINDINGS: The small right apical pneumothorax noted previously is smaller, with only a sliver of pneumothorax persisting at the right lateral apex. No change in the position of the right lateral hemithorax chest tube. There is persistent linear atelectasis at both lung bases. No new lung abnormalities. No convincing pleural effusion. IMPRESSION: 1. Decreased size the right pneumothorax, now minimal. 2. No other change from the earlier exam. 3. Stable right chest tube. Mild persistent linear lung base atelectasis. Electronically Signed   By: Lajean Manes M.D.   On: 05/17/2019 17:57   Dg Chest Port 1 View  Result Date: 05/17/2019 CLINICAL DATA:  Pneumothorax.  Chest tube. EXAM: PORTABLE CHEST 1 VIEW COMPARISON:  05/16/2019. FINDINGS: Right chest tube in stable position. Tiny right pneumothorax noted on today's exam. Persistent mild right base subsegmental atelectasis. No pleural effusion. Heart size normal. Stable mild right chest wall subcutaneous emphysema. IMPRESSION: 1. Right chest tube in stable position. Tiny right-sided pneumothorax noted on today's exam. 2.  Persistent mild right base subsegmental atelectasis. These results will be called to the ordering clinician or representative by the Radiologist Assistant, and communication documented in the PACS or zVision Dashboard. Electronically Signed   By: Marcello Moores  Register   On: 05/17/2019 09:06   Dg Chest Port 1 View  Result Date: 05/16/2019 CLINICAL DATA:  Right pneumothorax. EXAM: PORTABLE CHEST 1 VIEW COMPARISON:  05/15/2019. FINDINGS: Right chest tube in unchanged position. No pneumothorax noted on today's exam. Mild right base subsegmental atelectasis. No pleural effusion. Heart size stable. No acute bony abnormality. Stable mild right chest wall subcutaneous emphysema. IMPRESSION: 1. Right chest tube in unchanged position. No pneumothorax noted on today's exam. Stable mild right chest wall subcutaneous emphysema.  2.  Mild right base subsegmental atelectasis. Electronically Signed   By: Marcello Moores  Register   On: 05/16/2019 07:09   Dg Chest Port 1 View  Result Date: 05/15/2019 CLINICAL DATA:  58 year old male with history of pneumothorax. Follow-up study. EXAM: PORTABLE CHEST 1 VIEW COMPARISON:  Chest x-ray 05/14/2019. FINDINGS: Previously noted small bore right-sided chest tube remains stable in position in the lateral aspect of the right hemithorax. Small right apical pneumothorax occupying less than 5% of the volume of the right hemithorax, unchanged. Small amount of subcutaneous emphysema in the right lateral chest wall, also similar to the prior examination. Lung volumes are low. No consolidative airspace disease. No pleural effusions. No evidence of pulmonary edema. Heart size is normal. Upper mediastinal contours are within normal  limits. IMPRESSION: 1. Unchanged radiographic appearance the chest with small persistent right-sided pneumothorax, and right-sided chest tube (stable in position), as above. Electronically Signed   By: Vinnie Langton M.D.   On: 05/15/2019 07:39   Dg Chest Port 1 View  Result Date: 05/14/2019 CLINICAL DATA:  58 year old male with history of pneumothorax. EXAM: PORTABLE CHEST 1 VIEW COMPARISON:  Chest x-ray 05/13/2019. FINDINGS: Small bore pigtail drainage catheter is stable in position projecting over the lateral mid right hemithorax. Small residual pneumothorax in the apex of the right hemithorax, measuring less than 5% of the volume of the right hemithorax, unchanged compared to the prior examination. Lung volumes are low with bibasilar subsegmental atelectasis. No definite pleural effusions. No evidence of pulmonary edema. Heart size is normal. Upper mediastinal contours are within normal limits. Small amount of subcutaneous emphysema in the right chest wall, similar to the prior study. IMPRESSION: 1. Overall, the radiographic appearance the chest is essentially unchanged with small  right pneumothorax and right-sided chest tube which is unchanged in position. Electronically Signed   By: Vinnie Langton M.D.   On: 05/14/2019 08:16   Dg Chest Port 1 View  Result Date: 05/13/2019 CLINICAL DATA:  Large right pneumothorax, chest tube. EXAM: PORTABLE CHEST 1 VIEW COMPARISON:  05/12/2019 FINDINGS: Right chest tube remains in place. Small residual right apical pneumothorax, slightly increased in size since prior study. Right base atelectasis. Left lung clear. Heart is normal size. IMPRESSION: Right chest tube in place with small residual right apical pneumothorax, slightly increased since most recent study. Electronically Signed   By: Rolm Baptise M.D.   On: 05/13/2019 09:00   Dg Chest Portable 1 View  Result Date: 05/12/2019 CLINICAL DATA:  Status post chest tube insertion. EXAM: PORTABLE CHEST 1 VIEW COMPARISON:  05/12/2019 at 10:30 a.m. FINDINGS: New right-sided chest tube lies along the lateral mid right hemithorax. There is near complete re-expansion of the right lung with only a minimal pneumothorax noted in the right upper hemithorax. Subcutaneous air is seen along the lateral right chest wall. There are linear opacities in the right mid to lower lung consistent with atelectasis. Lungs otherwise clear. No pleural effusion. Cardiac silhouette is normal in size.  No mediastinal hilar masses. IMPRESSION: 1. S/p right-sided chest tube placement near complete re-expansion of the right lung since the earlier study. Electronically Signed   By: Lajean Manes M.D.   On: 05/12/2019 12:45     Time coordinating discharge: Over 30 minutes  SIGNED:   Gu[place]ford Shi, MD  [facility] 05/20/2019, 3:46 PM Pager : [phone]

## 2019-05-23 ENCOUNTER — Telehealth: Payer: Self-pay | Admitting: Pulmonary Disease

## 2019-05-24 ENCOUNTER — Ambulatory Visit: Payer: Medicare Other | Admitting: Pulmonary Disease

## 2019-05-24 ENCOUNTER — Other Ambulatory Visit: Payer: Self-pay | Admitting: Internal Medicine

## 2019-05-24 NOTE — Telephone Encounter (Signed)
Called and scheduled patient for PFT on 05/30/2019-pr

## 2019-05-25 ENCOUNTER — Other Ambulatory Visit (HOSPITAL_COMMUNITY)
Admission: RE | Admit: 2019-05-25 | Discharge: 2019-05-25 | Disposition: A | Discharge status: Home / Self Care | Payer: Medicare Other | Source: Ambulatory Visit | Attending: Internal Medicine | Admitting: Internal Medicine

## 2019-05-25 DIAGNOSIS — Z20828 Contact with and (suspected) exposure to other viral communicable diseases: Secondary | ICD-10-CM | POA: Insufficient documentation

## 2019-05-25 DIAGNOSIS — Z01812 Encounter for preprocedural laboratory examination: Secondary | ICD-10-CM | POA: Insufficient documentation

## 2019-05-25 LAB — SARS CORONAVIRUS 2 (TAT 6-24 HRS): SARS Coronavirus 2: NEGATIVE

## 2019-05-29 NOTE — Progress Notes (Addendum)
@Patient  ID: Jeremy Cunningham, male    DOB: May 16, 1961, 58 y.o.   MRN: [id number]  Chief Complaint  Patient presents with   Follow-up    PFT results - breathing improved    Referring provider: No ref. provider found  HPI:  58 year old male current every day smoker consulted with our practice on 05/12/2019 on arrival to hospital for worsening shortness of breath and found to have large right pneumothorax with complete collapse of right lung, requiring urgent thoracostomy.  PMH:  hypertension, hyperlipidemia, DDD Smoker/ Smoking History: Former Smoker. Quit 05/11/2019. 12.5 pack year.  Maintenance:  None Pt of: Dr. Tamala Julian   05/30/2019  - Visit   58 year old male former smoker presenting to our office today as a hospital follow-up.  Patient presented to the hospital on 05/12/2019 and found to have a complete collapse of right lung requiring chest tube placement.  Patient reports that since being discharged from the hospital he is done quite well.  He has not had any issues with shortness of breath.  He has Combivent at home which he has not had to use at all since being discharged from the hospital.  He is not on any sort of maintenance inhalers.  He stopped smoking on 05/11/2019.  Patient presenting to our office today to complete a pulmonary function test.  Pulmonary function testing results listed below:  05/30/2019-pulmonary function test-FVC 3.68 (95% predicted), postbronchodilator ratio 75, postbronchodilator FEV1 2.87 (94% predicted), no bronchodilator response, DLCO 24.35 (90% predicted)   Tests:   05/25/2019-SARS-CoV-2-negative  05/12/2019-HIV-nonreactive 05/12/2019-alpha-1- 102 05/12/2019-d-dimer-0.57  05/19/2019-CT chest without contrast- right chest tube is identified, there is minimal right pneumothorax as described significantly smaller compared to prior chest x-ray of July/29/2020  05/12/2019-chest x-ray- complete collapse of right lung with near large right pneumothorax, question  small nodule left lung base this could be further evaluated with an elective chest CT when patient is able  05/13/2019-echocardiogram- LV ejection fraction 60 to 65%, moderate basilar septal hypertrophy, right ventricle has normal systolic function and cavity is normal there is no increase in right ventricular wall thickness  05/30/2019-pulmonary function test-FVC 3.68 (95% predicted), postbronchodilator ratio 75, postbronchodilator FEV1 2.87 (94% predicted), no bronchodilator response, DLCO 24.35 (90% predicted)  SIX MIN WALK 05/30/2019  Supplimental Oxygen during Test? (L/min) No  Tech Comments: Moderate to rapid walk - no desaturation - no complaints - tolerated without difficulty - Denise Buckner RN      FENO:  No results found for: NITRICOXIDE  PFT: PFT Results Latest Ref Rng & Units 05/30/2019  FVC-Pre L 3.68  FVC-Predicted Pre % 95  FVC-Post L 3.80  FVC-Predicted Post % 98  Pre FEV1/FVC % % 76  Post FEV1/FCV % % 75  FEV1-Pre L 2.78  FEV1-Predicted Pre % 91  FEV1-Post L 2.87  DLCO UNC% % 90  DLCO COR %Predicted % 103  TLC L 6.82  TLC % Predicted % 102  RV % Predicted % 152    Imaging: Dg Chest 1 View  Result Date: 05/20/2019 CLINICAL DATA:  Pneumothorax EXAM: CHEST  1 VIEW COMPARISON:  05/20/2019, 6:05 a.m. FINDINGS: Unchanged AP portable examination with a pigtail chest tube about the right lateral pleural space and almost imperceptible, tiny right apical pneumothorax. The remainder of the lungs are normally aerated. The heart and mediastinum are unremarkable. IMPRESSION: Unchanged AP portable examination with a pigtail chest tube about the right lateral pleural space and almost imperceptible, tiny right apical pneumothorax. The remainder of the lungs are normally aerated. The  heart and mediastinum are unremarkable. Electronically Signed   By: Eddie Candle M.D.   On: 05/20/2019 14:57   Dg Chest 2 View  Result Date: 05/30/2019 CLINICAL DATA:  Follow-up right pneumothorax  EXAM: CHEST - 2 VIEW COMPARISON:  05/20/2019 FINDINGS: Right-sided chest tube has been removed in the interval. No recurrent pneumothorax is seen. No sizable effusion is noted. The lungs are clear. Cardiac shadow is within normal limits. No bony abnormality is noted. IMPRESSION: No evidence of pneumothorax. Electronically Signed   By: Inez Catalina M.D.   On: 05/30/2019 12:41   Dg Chest 2 View  Result Date: 05/12/2019 CLINICAL DATA:  Chest pain, shortness of breath EXAM: CHEST - 2 VIEW COMPARISON:  09/13/2012 FINDINGS: Complete collapse of the right lung with large right pneumothorax. No evidence of tension. Heart is normal size. Possible nodule at the base of the left lung. Otherwise left lung clear. No effusions. No acute bony abnormality. IMPRESSION: Complete collapse of the right lung with near large right pneumothorax. Question small nodule at the left lung base. This could be further evaluated with elective chest CT when patient is able. These results were called by telephone at the time of interpretation on 05/12/2019 at 10:52 am to Alyson Reedy, who verbally acknowledged these results. Electronically Signed   By: Rolm Baptise M.D.   On: 05/12/2019 10:55   Ct Chest Wo Contrast  Result Date: 05/19/2019 CLINICAL DATA:  Pneumothorax follow-up EXAM: CT CHEST WITHOUT CONTRAST TECHNIQUE: Multidetector CT imaging of the chest was performed following the standard protocol without IV contrast. COMPARISON:  Chest x-ray May 19, 2019, chest x-ray May 18, 2019 FINDINGS: Cardiovascular: No significant vascular findings. Normal heart size. No pericardial effusion. Mediastinum/Nodes: No enlarged mediastinal or axillary lymph nodes. Thyroid gland, trachea, and esophagus demonstrate no significant findings. Lungs/Pleura: Right chest tube is identified unchanged compared prior exam. Minimal medial right apical pneumothorax is identified significantly smaller compared to prior chest x-ray of May 18, 2019. A tiny  sliver of air is identified in the pleural space laterally at the entrance of the right chest tube. Minimal sliver air is identified in the medial posterior right pleural space at the level of the mid chest. Bullous change of the medial left upper lobe is identified. There is atelectasis of the posterior right lung base with a small right pleural effusion. 2 mm calcified nodule is identified in the right middle lobe image 94 series 4 Upper Abdomen: No acute abnormality. Musculoskeletal: No acute abnormality. IMPRESSION: Right chest tube is identified. There is minimal right pneumothorax as described significantly smaller compared to prior chest x-ray of May 18, 2019. Atelectasis of the right lung base with associated small right pleural effusion. Electronically Signed   By: Abelardo Diesel M.D.   On: 05/19/2019 12:51   Dg Chest Port 1 View  Result Date: 05/20/2019 CLINICAL DATA:  Pneumothorax.  Chest tube. EXAM: PORTABLE CHEST 1 VIEW COMPARISON:  CT chest 05/19/2019.  Chest x-ray 05/19/2019. FINDINGS: Mediastinum hilar structures normal. Mild right base subsegmental atelectasis. Right chest tube in stable position. Tiny right apical pneumothorax is almost completely cleared. IMPRESSION: Right chest tube in stable position. Tiny right apical pneumothorax has almost completely cleared. Electronically Signed   By: Marcello Moores  Register   On: 05/20/2019 07:48   Dg Chest Port 1 View  Result Date: 05/19/2019 CLINICAL DATA:  Recent pneumothorax with chest tube present EXAM: PORTABLE CHEST 1 VIEW COMPARISON:  May 18, 2019 FINDINGS: Chest tube remains on the right peripherally. Right  apical region pneumothorax is smaller compared to 1 day prior. No tension component. No edema or consolidation. Heart size and pulmonary vascular normal. No adenopathy. No bone lesions. IMPRESSION: Right apical pneumothorax smaller compared to 1 day prior. No tension component. Chest tube position unchanged. No edema or consolidation. Stable  cardiac silhouette. Electronically Signed   By: Lowella Grip III M.D.   On: 05/19/2019 09:36   Dg Chest Port 1 View  Result Date: 05/18/2019 CLINICAL DATA:  Follow-up pneumothorax. EXAM: PORTABLE CHEST 1 VIEW COMPARISON:  Multiple previous chest x-rays. The most recent is 05/17/2019 FINDINGS: The right-sided chest tube is stable. There is a small recurrent pneumothorax estimated at 10-15%. Persistent streaky basilar atelectasis. IMPRESSION: Interval progression of right-sided pneumothorax estimated at 10-15%. Electronically Signed   By: Marijo Sanes M.D.   On: 05/18/2019 08:48   Dg Chest Port 1 View  Result Date: 05/17/2019 CLINICAL DATA:  Followup right pneumothorax and chest tube. EXAM: PORTABLE CHEST 1 VIEW COMPARISON:  05/17/2019 at 6:08 a.m. FINDINGS: The small right apical pneumothorax noted previously is smaller, with only a sliver of pneumothorax persisting at the right lateral apex. No change in the position of the right lateral hemithorax chest tube. There is persistent linear atelectasis at both lung bases. No new lung abnormalities. No convincing pleural effusion. IMPRESSION: 1. Decreased size the right pneumothorax, now minimal. 2. No other change from the earlier exam. 3. Stable right chest tube. Mild persistent linear lung base atelectasis. Electronically Signed   By: Lajean Manes M.D.   On: 05/17/2019 17:57   Dg Chest Port 1 View  Result Date: 05/17/2019 CLINICAL DATA:  Pneumothorax.  Chest tube. EXAM: PORTABLE CHEST 1 VIEW COMPARISON:  05/16/2019. FINDINGS: Right chest tube in stable position. Tiny right pneumothorax noted on today's exam. Persistent mild right base subsegmental atelectasis. No pleural effusion. Heart size normal. Stable mild right chest wall subcutaneous emphysema. IMPRESSION: 1. Right chest tube in stable position. Tiny right-sided pneumothorax noted on today's exam. 2.  Persistent mild right base subsegmental atelectasis. These results will be called to the  ordering clinician or representative by the Radiologist Assistant, and communication documented in the PACS or zVision Dashboard. Electronically Signed   By: Marcello Moores  Register   On: 05/17/2019 09:06   Dg Chest Port 1 View  Result Date: 05/16/2019 CLINICAL DATA:  Right pneumothorax. EXAM: PORTABLE CHEST 1 VIEW COMPARISON:  05/15/2019. FINDINGS: Right chest tube in unchanged position. No pneumothorax noted on today's exam. Mild right base subsegmental atelectasis. No pleural effusion. Heart size stable. No acute bony abnormality. Stable mild right chest wall subcutaneous emphysema. IMPRESSION: 1. Right chest tube in unchanged position. No pneumothorax noted on today's exam. Stable mild right chest wall subcutaneous emphysema. 2.  Mild right base subsegmental atelectasis. Electronically Signed   By: Marcello Moores  Register   On: 05/16/2019 07:09   Dg Chest Port 1 View  Result Date: 05/15/2019 CLINICAL DATA:  58 year old male with history of pneumothorax. Follow-up study. EXAM: PORTABLE CHEST 1 VIEW COMPARISON:  Chest x-ray 05/14/2019. FINDINGS: Previously noted small bore right-sided chest tube remains stable in position in the lateral aspect of the right hemithorax. Small right apical pneumothorax occupying less than 5% of the volume of the right hemithorax, unchanged. Small amount of subcutaneous emphysema in the right lateral chest wall, also similar to the prior examination. Lung volumes are low. No consolidative airspace disease. No pleural effusions. No evidence of pulmonary edema. Heart size is normal. Upper mediastinal contours are within normal limits. IMPRESSION:  1. Unchanged radiographic appearance the chest with small persistent right-sided pneumothorax, and right-sided chest tube (stable in position), as above. Electronically Signed   By: Vinnie Langton M.D.   On: 05/15/2019 07:39   Dg Chest Port 1 View  Result Date: 05/14/2019 CLINICAL DATA:  58 year old male with history of pneumothorax. EXAM:  PORTABLE CHEST 1 VIEW COMPARISON:  Chest x-ray 05/13/2019. FINDINGS: Small bore pigtail drainage catheter is stable in position projecting over the lateral mid right hemithorax. Small residual pneumothorax in the apex of the right hemithorax, measuring less than 5% of the volume of the right hemithorax, unchanged compared to the prior examination. Lung volumes are low with bibasilar subsegmental atelectasis. No definite pleural effusions. No evidence of pulmonary edema. Heart size is normal. Upper mediastinal contours are within normal limits. Small amount of subcutaneous emphysema in the right chest wall, similar to the prior study. IMPRESSION: 1. Overall, the radiographic appearance the chest is essentially unchanged with small right pneumothorax and right-sided chest tube which is unchanged in position. Electronically Signed   By: Vinnie Langton M.D.   On: 05/14/2019 08:16   Dg Chest Port 1 View  Result Date: 05/13/2019 CLINICAL DATA:  Large right pneumothorax, chest tube. EXAM: PORTABLE CHEST 1 VIEW COMPARISON:  05/12/2019 FINDINGS: Right chest tube remains in place. Small residual right apical pneumothorax, slightly increased in size since prior study. Right base atelectasis. Left lung clear. Heart is normal size. IMPRESSION: Right chest tube in place with small residual right apical pneumothorax, slightly increased since most recent study. Electronically Signed   By: Rolm Baptise M.D.   On: 05/13/2019 09:00   Dg Chest Portable 1 View  Result Date: 05/12/2019 CLINICAL DATA:  Status post chest tube insertion. EXAM: PORTABLE CHEST 1 VIEW COMPARISON:  05/12/2019 at 10:30 a.m. FINDINGS: New right-sided chest tube lies along the lateral mid right hemithorax. There is near complete re-expansion of the right lung with only a minimal pneumothorax noted in the right upper hemithorax. Subcutaneous air is seen along the lateral right chest wall. There are linear opacities in the right mid to lower lung  consistent with atelectasis. Lungs otherwise clear. No pleural effusion. Cardiac silhouette is normal in size.  No mediastinal hilar masses. IMPRESSION: 1. S/p right-sided chest tube placement near complete re-expansion of the right lung since the earlier study. Electronically Signed   By: Lajean Manes M.D.   On: 05/12/2019 12:45      Specialty Problems      Pulmonary Problems   Pneumothorax    05/12/2019-chest x-ray- complete collapse of right lung with near large right pneumothorax, question small nodule left lung base this could be further evaluated with an elective chest CT when patient is able  05/19/2019-CT chest without contrast- right chest tube is identified, there is minimal right pneumothorax as described significantly smaller compared to prior chest x-ray of July/29/2020          No Known Allergies   There is no immunization history on file for this patient.  Past Medical History:  Diagnosis Date   Arthritis    back   Hyperlipidemia     Tobacco History: Social History   Tobacco Use  Smoking Status Former Smoker   Packs/day: 0.50   Years: 25.00   Pack years: 12.50   Types: Cigarettes   Start date: 10/29/1993   Quit date: 05/11/2019   Years since quitting: 0.0  Smokeless Tobacco Never Used  Tobacco Comment   quit May 11, 2019   Counseling  given: Yes Comment: quit May 11, 2019  Continue to not smoke  Smoking assessment and cessation counseling  Patient currently smoking: 0 cigarettes, stop smoking on 05/11/2019 I have advised the patient to quit/stop smoking as soon as possible due to high risk for multiple medical problems.  It will also be very difficult for [place] to manage patient's  respiratory symptoms and status if we continue to expose her lungs to a known irritant.  We do not advise e-cigarettes as a form of stopping smoking.  Patient is  willing to quit smoking.  Quit smoking on 05/11/2019.  I have advised the patient that we can assist  and have options of nicotine replacement therapy, provided smoking cessation education today, provided smoking cessation counseling, and provided cessation resources.  Patient is thinking about trying nicotine gum.  Currently right now chewing juicy Fruit gum.  He feels that this is working well.  Declined nicotine replacement therapies today.  Follow-up next office visit office visit for assessment of smoking cessation.    Smoking cessation counseling advised for: 50min    Outpatient Encounter Medications as of 05/30/2019  Medication Sig   amLODipine (NORVASC) 5 MG tablet Take 5 mg by mouth daily.   fenofibrate (TRICOR) 145 MG tablet Take 145 mg by mouth daily.   Ipratropium-Albuterol (COMBIVENT RESPIMAT) 20-100 MCG/ACT AERS respimat Inhale 1 puff into the lungs every 6 (six) hours as needed for wheezing or shortness of breath.   Omega-3 Fatty Acids (FISH OIL PO) Take 1 tablet by mouth daily.   No facility-administered encounter medications on file as of 05/30/2019.      Review of Systems  Review of Systems  Constitutional: Negative for activity change, chills, fatigue, fever and unexpected weight change.  HENT: Negative for postnasal drip, rhinorrhea, sinus pressure, sinus pain and sore throat.   Eyes: Negative.   Respiratory: Negative for cough, shortness of breath and wheezing.   Cardiovascular: Negative for chest pain and palpitations.  Gastrointestinal: Negative for constipation, diarrhea, nausea and vomiting.  Endocrine: Negative.   Genitourinary: Negative.   Musculoskeletal: Negative.   Skin: Negative.   Neurological: Negative for dizziness and headaches.  Psychiatric/Behavioral: Negative.  Negative for dysphoric mood. The patient is not nervous/anxious.   All other systems reviewed and are negative.    Physical Exam  BP 120/80 (BP Location: Left Arm, Patient Position: Sitting, Cuff Size: Normal)    Pulse 87    Temp 98.3 F (36.8 C)    Ht 5' 8.5" (1.74 m)    Wt 191  lb (86.6 kg)    SpO2 96%    BMI 28.62 kg/m   Wt Readings from Last 5 Encounters:  05/30/19 191 lb (86.6 kg)  05/12/19 187 lb 6.3 oz (85 kg)  04/05/13 200 lb (90.7 kg)  03/22/13 200 lb 3.2 oz (90.8 kg)    Physical Exam Vitals signs and nursing note reviewed.  Constitutional:      General: He is not in acute distress.    Appearance: Normal appearance. He is normal weight.  HENT:     Head: Normocephalic and atraumatic.     Right Ear: Hearing, tympanic membrane, ear canal and external ear normal.     Left Ear: Hearing, tympanic membrane, ear canal and external ear normal.     Nose: Nose normal. No mucosal edema, congestion or rhinorrhea.     Right Turbinates: Not enlarged.     Left Turbinates: Not enlarged.     Mouth/Throat:     Mouth: Mucous  membranes are dry.     Pharynx: Oropharynx is clear. No oropharyngeal exudate.  Eyes:     Pupils: Pupils are equal, round, and reactive to light.  Neck:     Musculoskeletal: Normal range of motion.  Cardiovascular:     Rate and Rhythm: Normal rate and regular rhythm.     Pulses: Normal pulses.     Heart sounds: Normal heart sounds. No murmur.  Pulmonary:     Effort: Pulmonary effort is normal.     Breath sounds: Normal breath sounds. No decreased breath sounds, wheezing or rales.  Musculoskeletal:     Right lower leg: No edema.     Left lower leg: No edema.  Lymphadenopathy:     Cervical: No cervical adenopathy.  Skin:    General: Skin is warm and dry.     Capillary Refill: Capillary refill takes less than 2 seconds.     Findings: No erythema or rash.  Neurological:     General: No focal deficit present.     Mental Status: He is alert and oriented to person, place, and time.     Motor: No weakness.     Coordination: Coordination normal.     Gait: Gait is intact. Gait normal.  Psychiatric:        Mood and Affect: Mood normal.        Behavior: Behavior normal. Behavior is cooperative.        Thought Content: Thought content  normal.        Judgment: Judgment normal.      Lab Results:  CBC    Component Value Date/Time   WBC 2.9 (L) 05/14/2019 0250   RBC 4.28 05/14/2019 0250   HGB 13.3 05/14/2019 0250   HCT 40.9 05/14/2019 0250   PLT 256 05/14/2019 0250   MCV 95.6 05/14/2019 0250   MCH 31.1 05/14/2019 0250   MCHC 32.5 05/14/2019 0250   RDW 12.8 05/14/2019 0250   LYMPHSABS 1.5 03/14/2010 0735   MONOABS 0.4 03/14/2010 0735   EOSABS 0.2 03/14/2010 0735   BASOSABS 0.0 03/14/2010 0735    BMET    Component Value Date/Time   NA 136 05/14/2019 0250   K 4.0 05/14/2019 0250   CL 104 05/14/2019 0250   CO2 25 05/14/2019 0250   GLUCOSE 112 (H) 05/14/2019 0250   BUN 16 05/14/2019 0250   CREATININE 1.06 05/14/2019 0250   CALCIUM 9.1 05/14/2019 0250   GFRNONAA >60 05/14/2019 0250   GFRAA >60 05/14/2019 0250    BNP No results found for: BNP  ProBNP No results found for: PROBNP    Assessment & Plan:   Pneumothorax Plan: Chest x-ray today Walk today in office Pulmonary function test today normal and unrevealing Continue to not smoke Follow-up with our office in 3 months or sooner if symptoms worsen  Former smoker Plan: Continue to not smoke Can purchase over-the-counter nicotine gum as needed to help with smoking cessation Contact our office if you need a prescription to help with making these methods more cost effective If you feel like you may relapse or you start smoking again please contact our office so that we can see you    Return in about 3 months (around 08/30/2019), or if symptoms worsen or fail to improve, for Follow up with Dr. Tamala Julian.   Lauraine Rinne, NP 05/30/2019   This appointment was 28 minutes long with over 50% of the time in direct face-to-face patient care, assessment, plan of care, and follow-up.  Attending note: reviewed and agree with above.

## 2019-05-30 ENCOUNTER — Ambulatory Visit (INDEPENDENT_AMBULATORY_CARE_PROVIDER_SITE_OTHER): Payer: Medicare Other

## 2019-05-30 ENCOUNTER — Encounter: Payer: Self-pay | Admitting: Pulmonary Disease

## 2019-05-30 ENCOUNTER — Other Ambulatory Visit: Payer: Self-pay

## 2019-05-30 ENCOUNTER — Ambulatory Visit (INDEPENDENT_AMBULATORY_CARE_PROVIDER_SITE_OTHER): Payer: Medicare Other | Admitting: Pulmonary Disease

## 2019-05-30 VITALS — BP 120/80 | HR 87 | Temp 98.3°F | Ht 68.5 in | Wt 191.0 lb

## 2019-05-30 DIAGNOSIS — J939 Pneumothorax, unspecified: Secondary | ICD-10-CM

## 2019-05-30 DIAGNOSIS — Z87891 Personal history of nicotine dependence: Secondary | ICD-10-CM | POA: Diagnosis not present

## 2019-05-30 LAB — PULMONARY FUNCTION TEST
DL/VA % pred: 103 %
DL/VA: 4.47 ml/min/mmHg/L
DLCO cor % pred: 93 %
DLCO cor: 25.33 ml/min/mmHg
DLCO unc % pred: 90 %
DLCO unc: 24.35 ml/min/mmHg
FEF 25-75 Post: 2.32 L/sec
FEF 25-75 Pre: 2.22 L/sec
FEF2575-%Change-Post: 4 %
FEF2575-%Pred-Post: 78 %
FEF2575-%Pred-Pre: 75 %
FEV1-%Change-Post: 3 %
FEV1-%Pred-Post: 94 %
FEV1-%Pred-Pre: 91 %
FEV1-Post: 2.87 L
FEV1-Pre: 2.78 L
FEV1FVC-%Change-Post: 0 %
FEV1FVC-%Pred-Pre: 95 %
FEV6-%Change-Post: 3 %
FEV6-%Pred-Post: 101 %
FEV6-%Pred-Pre: 98 %
FEV6-Post: 3.79 L
FEV6-Pre: 3.67 L
FEV6FVC-%Change-Post: 0 %
FEV6FVC-%Pred-Post: 103 %
FEV6FVC-%Pred-Pre: 103 %
FVC-%Change-Post: 3 %
FVC-%Pred-Post: 98 %
FVC-%Pred-Pre: 95 %
FVC-Post: 3.8 L
FVC-Pre: 3.68 L
Post FEV1/FVC ratio: 75 %
Post FEV6/FVC ratio: 100 %
Pre FEV1/FVC ratio: 76 %
Pre FEV6/FVC Ratio: 100 %
RV % pred: 152 %
RV: 3.17 L
TLC % pred: 102 %
TLC: 6.82 L

## 2019-05-30 NOTE — Patient Instructions (Addendum)
Continue to not smoking    Walk today   Chest Xray today   We recommend that you continue to stop smoking.  >>>If you have friends or family who smoke, let them know you are trying to quit and not to smoke around you or in your living environment  Smoking Cessation Resources:  1 800 QUIT NOW  >>> Patient to call this resource and utilize it to help support her quit smoking >>> Keep up your hard work with stopping smoking  You can also contact the [facility] >>>For smoking cessation classes call [phone]  We do not recommend using e-cigarettes as a form of stopping smoking  You can sign up for smoking cessation support texts and information:  >>>https://smokefree.gov/smokefreetxt    Nicotine Gum:  >>>Smokers who smoke 25 or more cigarettes a day should use 4 mg dose >>>Smokers who smoke fewer than 25 cigarettes a day should use 2 mg dose  Proper chewing of gum is important for optimal results.   >>>Do not chew gum to rapidly.  Once you start chewing eating tasty peppery taste and slide the gum to your cheek.  When the taste disappears to a few more times.  Repeat this for 30 minutes.  Then discard the gum.  >>>Avoid acidic beverages such as coffee, carbonated beverages before and during gum use.  A soft acidic beverages lower oral pH which cause nicotine to not be absorbed properly >>>If you chew the gum too quickly or vigorously you could have nausea, vomiting, abdominal pain, constipation, hiccups, headache, sore jaw, mouth irritation ulcers   Return in about 3 months (around 08/30/2019), or if symptoms worsen or fail to improve, for Follow up with Dr. Tamala Julian.   Coronavirus (COVID-19) Are you at risk?  Are you at risk for the Coronavirus (COVID-19)?  To be considered HIGH RISK for Coronavirus (COVID-19), you have to meet the following criteria:  . Traveled to [place], [place], [place], [place] or [place]; or in the [place] to [place], [place], [place], or [place]; and have fever, cough, and shortness of breath within the last 2 weeks of travel OR . Been in close contact with a person diagnosed with COVID-19 within the last 2 weeks and have fever, cough, and shortness of breath . IF YOU DO NOT MEET THESE CRITERIA, YOU ARE CONSIDERED LOW RISK FOR COVID-19.  What to do if you are HIGH RISK for COVID-19?  Marland Kitchen If you are having a medical emergency, call 911. . Seek medical care right away. Before you go to a doctor's office, urgent care or emergency department, call ahead and tell them about your recent travel, contact with someone diagnosed with COVID-19, and your symptoms. You should receive instructions from your physician's office regarding next steps of care.  . When you arrive at healthcare provider, tell the healthcare staff immediately you have returned from visiting [place], [place], [place], [place] or [place]; or traveled in the [place] to [place], [place], [place], or [place]; in the last two weeks or you have been in close contact with a person diagnosed with COVID-19 in the last 2 weeks.   . Tell the health care staff about your symptoms: fever, cough and shortness of breath. . After you have been seen by a medical provider, you will be either: o Tested for (COVID-19) and discharged home on quarantine except to seek medical care if symptoms worsen, and asked to  - Stay home and avoid  contact with others until you get your results (4-5 days)  - Avoid travel on public transportation if possible (such as bus, train, or airplane) or o Sent to the Emergency Department by EMS for evaluation, COVID-19 testing, and possible admission depending on your condition and test results.  What to do if you are LOW RISK for COVID-19?  Reduce your risk of any infection by using the same precautions used for avoiding the common cold or flu:  Marland Kitchen Wash your hands often with soap and warm water for at least 20 seconds.  If soap and water are  not readily available, use an alcohol-based hand sanitizer with at least 60% alcohol.  . If coughing or sneezing, cover your mouth and nose by coughing or sneezing into the elbow areas of your shirt or coat, into a tissue or into your sleeve (not your hands). . Avoid shaking hands with others and consider head nods or verbal greetings only. . Avoid touching your eyes, nose, or mouth with unwashed hands.  . Avoid close contact with people who are sick. . Avoid places or events with large numbers of people in one location, like concerts or sporting events. . Carefully consider travel plans you have or are making. . If you are planning any travel outside or inside the [place], visit the CDC's Travelers' Health webpage for the latest health notices. . If you have some symptoms but not all symptoms, continue to monitor at home and seek medical attention if your symptoms worsen. . If you are having a medical emergency, call 911.   [place] / e-Visit: [email or web address]         [facility]: [facility]: [phone]                   [facility]: [phone]           It is flu season:   >>> Best ways to protect herself from the flu: Receive the yearly flu vaccine, practice good hand hygiene washing with soap and also using hand sanitizer when available, eat a nutritious meals, get adequate rest, hydrate appropriately   Please contact the office if your symptoms worsen or you have concerns that you are not improving.   Thank you for choosing [facility] for your healthcare, and for allowing [place] to partner with you on your healthcare journey. I am thankful to be able to provide care to you today.   Wyn Quaker FNP-C    Coping with Quitting Smoking  Quitting smoking is a physical and mental challenge. You will face cravings,  withdrawal symptoms, and temptation. Before quitting, work with your health care provider to make a plan that can help you cope. Preparation can help you quit and keep you from giving in. How can I cope with cravings? Cravings usually last for 5-10 minutes. If you get through it, the craving will pass. Consider taking the following actions to help you cope with cravings:  Keep your mouth busy: ? Chew sugar-free gum. ? Suck on hard candies or a straw. ? Brush your teeth.  Keep your hands and body busy: ? Immediately change to a different activity when you feel a craving. ? Squeeze or play with a ball. ? Do an activity or a hobby, like making bead jewelry, practicing needlepoint, or working with wood. ? Mix up your normal routine. ? Take a short exercise break. Go for  a quick walk or run up and down stairs. ? Spend time in public places where smoking is not allowed.  Focus on doing something kind or helpful for someone else.  Call a friend or family member to talk during a craving.  Join a support group.  Call a quit line, such as 1-800-QUIT-NOW.  Talk with your health care provider about medicines that might help you cope with cravings and make quitting easier for you. How can I deal with withdrawal symptoms? Your body may experience negative effects as it tries to get used to not having nicotine in the system. These effects are called withdrawal symptoms. They may include:  Feeling hungrier than normal.  Trouble concentrating.  Irritability.  Trouble sleeping.  Feeling depressed.  Restlessness and agitation.  Craving a cigarette. To manage withdrawal symptoms:  Avoid places, people, and activities that trigger your cravings.  Remember why you want to quit.  Get plenty of sleep.  Avoid coffee and other caffeinated drinks. These may worsen some of your symptoms. How can I handle social situations? Social situations can be difficult when you are quitting smoking,  especially in the first few weeks. To manage this, you can:  Avoid parties, bars, and other social situations where people might be smoking.  Avoid alcohol.  Leave right away if you have the urge to smoke.  Explain to your family and friends that you are quitting smoking. Ask for understanding and support.  Plan activities with friends or family where smoking is not an option. What are some ways I can cope with stress? Wanting to smoke may cause stress, and stress can make you want to smoke. Find ways to manage your stress. Relaxation techniques can help. For example:  Breathe slowly and deeply, in through your nose and out through your mouth.  Listen to soothing, relaxing music.  Talk with a family member or friend about your stress.  Light a candle.  Soak in a bath or take a shower.  Think about a peaceful place. What are some ways I can prevent weight gain? Be aware that many people gain weight after they quit smoking. However, not everyone does. To keep from gaining weight, have a plan in place before you quit and stick to the plan after you quit. Your plan should include:  Having healthy snacks. When you have a craving, it may help to: ? Eat plain popcorn, crunchy carrots, celery, or other cut vegetables. ? Chew sugar-free gum.  Changing how you eat: ? Eat small portion sizes at meals. ? Eat 4-6 small meals throughout the day instead of 1-2 large meals a day. ? Be mindful when you eat. Do not watch television or do other things that might distract you as you eat.  Exercising regularly: ? Make time to exercise each day. If you do not have time for a long workout, do short bouts of exercise for 5-10 minutes several times a day. ? Do some form of strengthening exercise, like weight lifting, and some form of aerobic exercise, like running or swimming.  Drinking plenty of water or other low-calorie or no-calorie drinks. Drink 6-8 glasses of water daily, or as much as  instructed by your health care provider. Summary  Quitting smoking is a physical and mental challenge. You will face cravings, withdrawal symptoms, and temptation to smoke again. Preparation can help you as you go through these challenges.  You can cope with cravings by keeping your mouth busy (such as by chewing gum), keeping  your body and hands busy, and making calls to family, friends, or a helpline for people who want to quit smoking.  You can cope with withdrawal symptoms by avoiding places where people smoke, avoiding drinks with caffeine, and getting plenty of rest.  Ask your health care provider about the different ways to prevent weight gain, avoid stress, and handle social situations. This information is not intended to replace advice given to you by your health care provider. Make sure you discuss any questions you have with your health care provider. Document Released: 10/03/2016 Document Revised: 09/18/2017 Document Reviewed: 10/03/2016 Elsevier Patient Education  2020 [employer].

## 2019-05-30 NOTE — Assessment & Plan Note (Signed)
Plan: Chest x-ray today Walk today in office Pulmonary function test today normal and unrevealing Continue to not smoke Follow-up with our office in 3 months or sooner if symptoms worsen

## 2019-05-30 NOTE — Assessment & Plan Note (Signed)
Plan: Continue to not smoke Can purchase over-the-counter nicotine gum as needed to help with smoking cessation Contact our office if you need a prescription to help with making these methods more cost effective If you feel like you may relapse or you start smoking again please contact our office so that we can see you

## 2019-05-30 NOTE — Progress Notes (Signed)
Your chest x-ray results of come back.  Showing no acute changes.  No plan of care changes at this time.  Keep follow-up appointment.    Follow-up with our office if symptoms worsen or you do not feel like you are improving under her current regimen.  It was a pleasure taking care of you,  Brian Mack, FNP 

## 2019-05-30 NOTE — Progress Notes (Signed)
Full PFT performed today. °

## 2019-08-09 IMAGING — DX PORTABLE CHEST - 1 VIEW
1 series · 1 of 1 positions shown · non-contrast
Comparison: Chest x-ray 05/13/2019.

CLINICAL DATA: 58-year-old male with history of pneumothorax.

EXAM:
PORTABLE CHEST 1 VIEW

[chest ap]
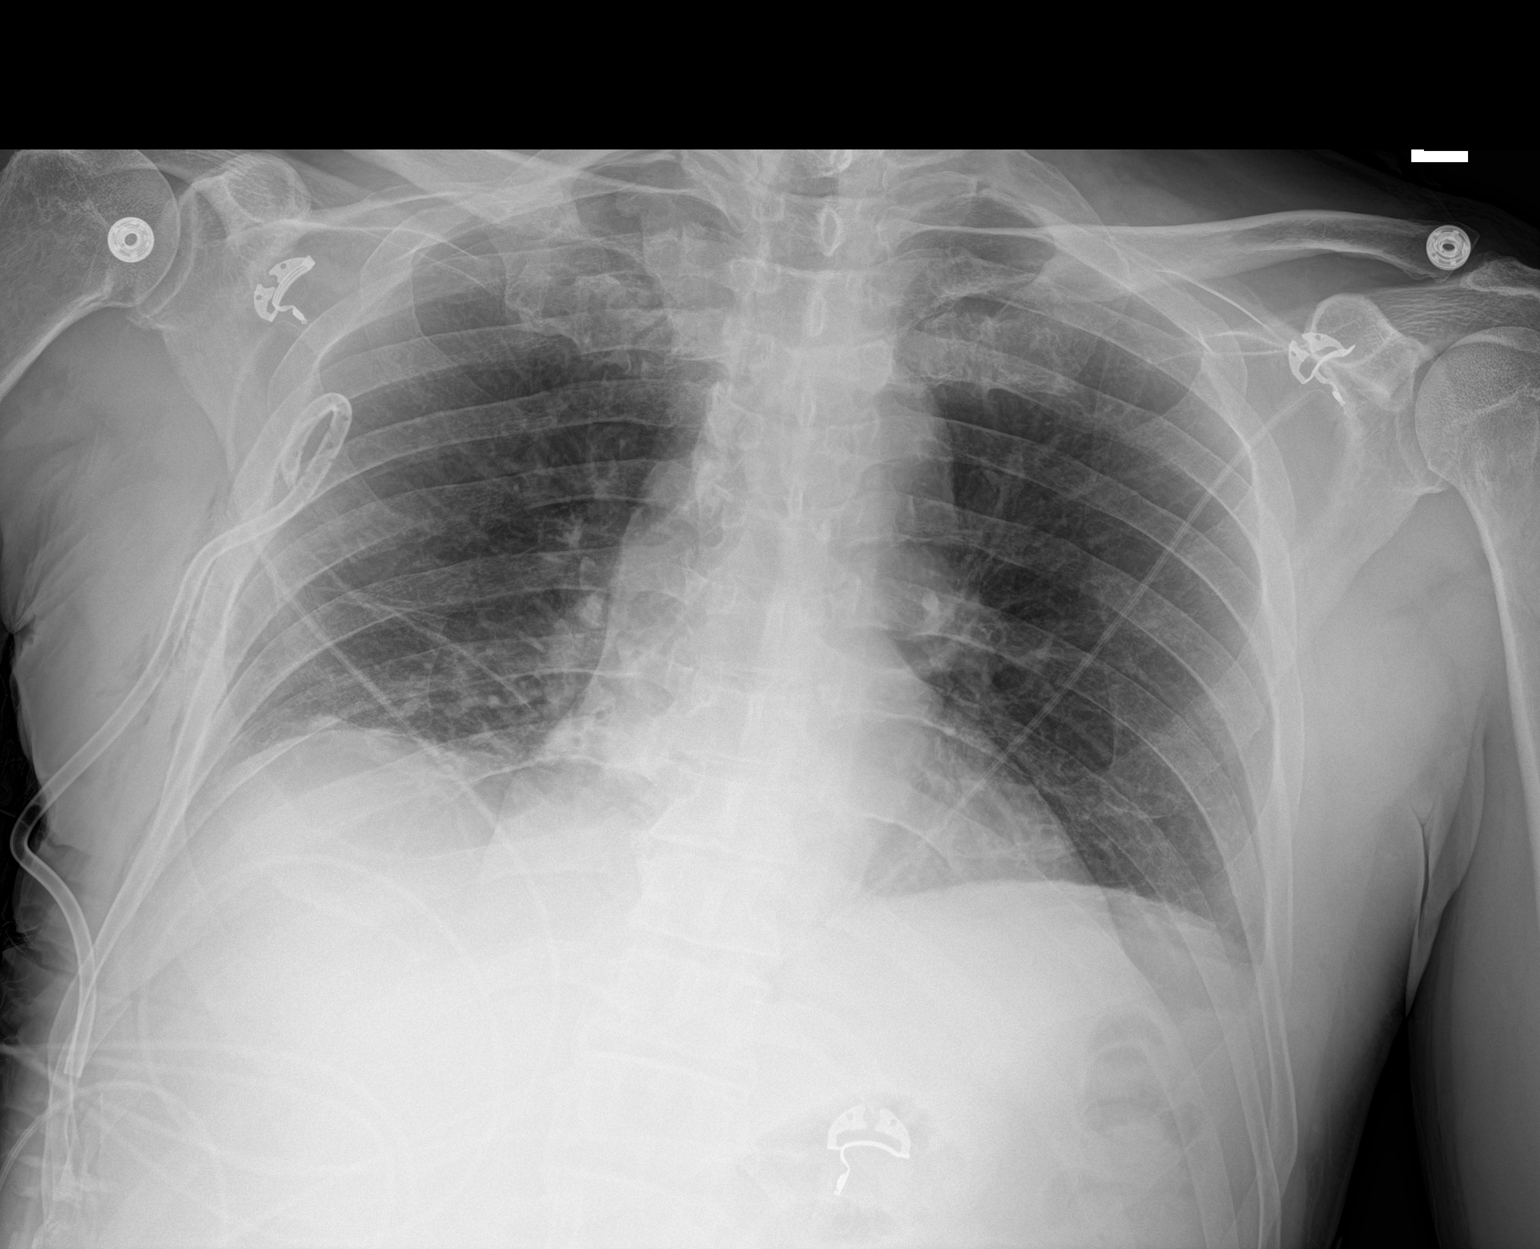

[1 of 1 positions shown; findings below may reference images not displayed]

FINDINGS: Small bore pigtail drainage catheter is stable in position
projecting over the lateral mid right hemithorax. Small residual
pneumothorax in the apex of the right hemithorax, measuring less
than 5% of the volume of the right hemithorax, unchanged compared to
the prior examination. Lung volumes are low with bibasilar
subsegmental atelectasis. No definite pleural effusions. No evidence
of pulmonary edema. Heart size is normal. Upper mediastinal contours
are within normal limits. Small amount of subcutaneous emphysema in
the right chest wall, similar to the prior study.
IMPRESSION: 1. Overall, the radiographic appearance the chest is essentially
unchanged with small right pneumothorax and right-sided chest tube
which is unchanged in position.

## 2019-08-10 IMAGING — DX PORTABLE CHEST - 1 VIEW
1 series · 1 of 1 positions shown · non-contrast
Comparison: Chest x-ray 05/14/2019.

CLINICAL DATA: 58-year-old male with history of pneumothorax.
Follow-up study.

EXAM:
PORTABLE CHEST 1 VIEW

[chest]
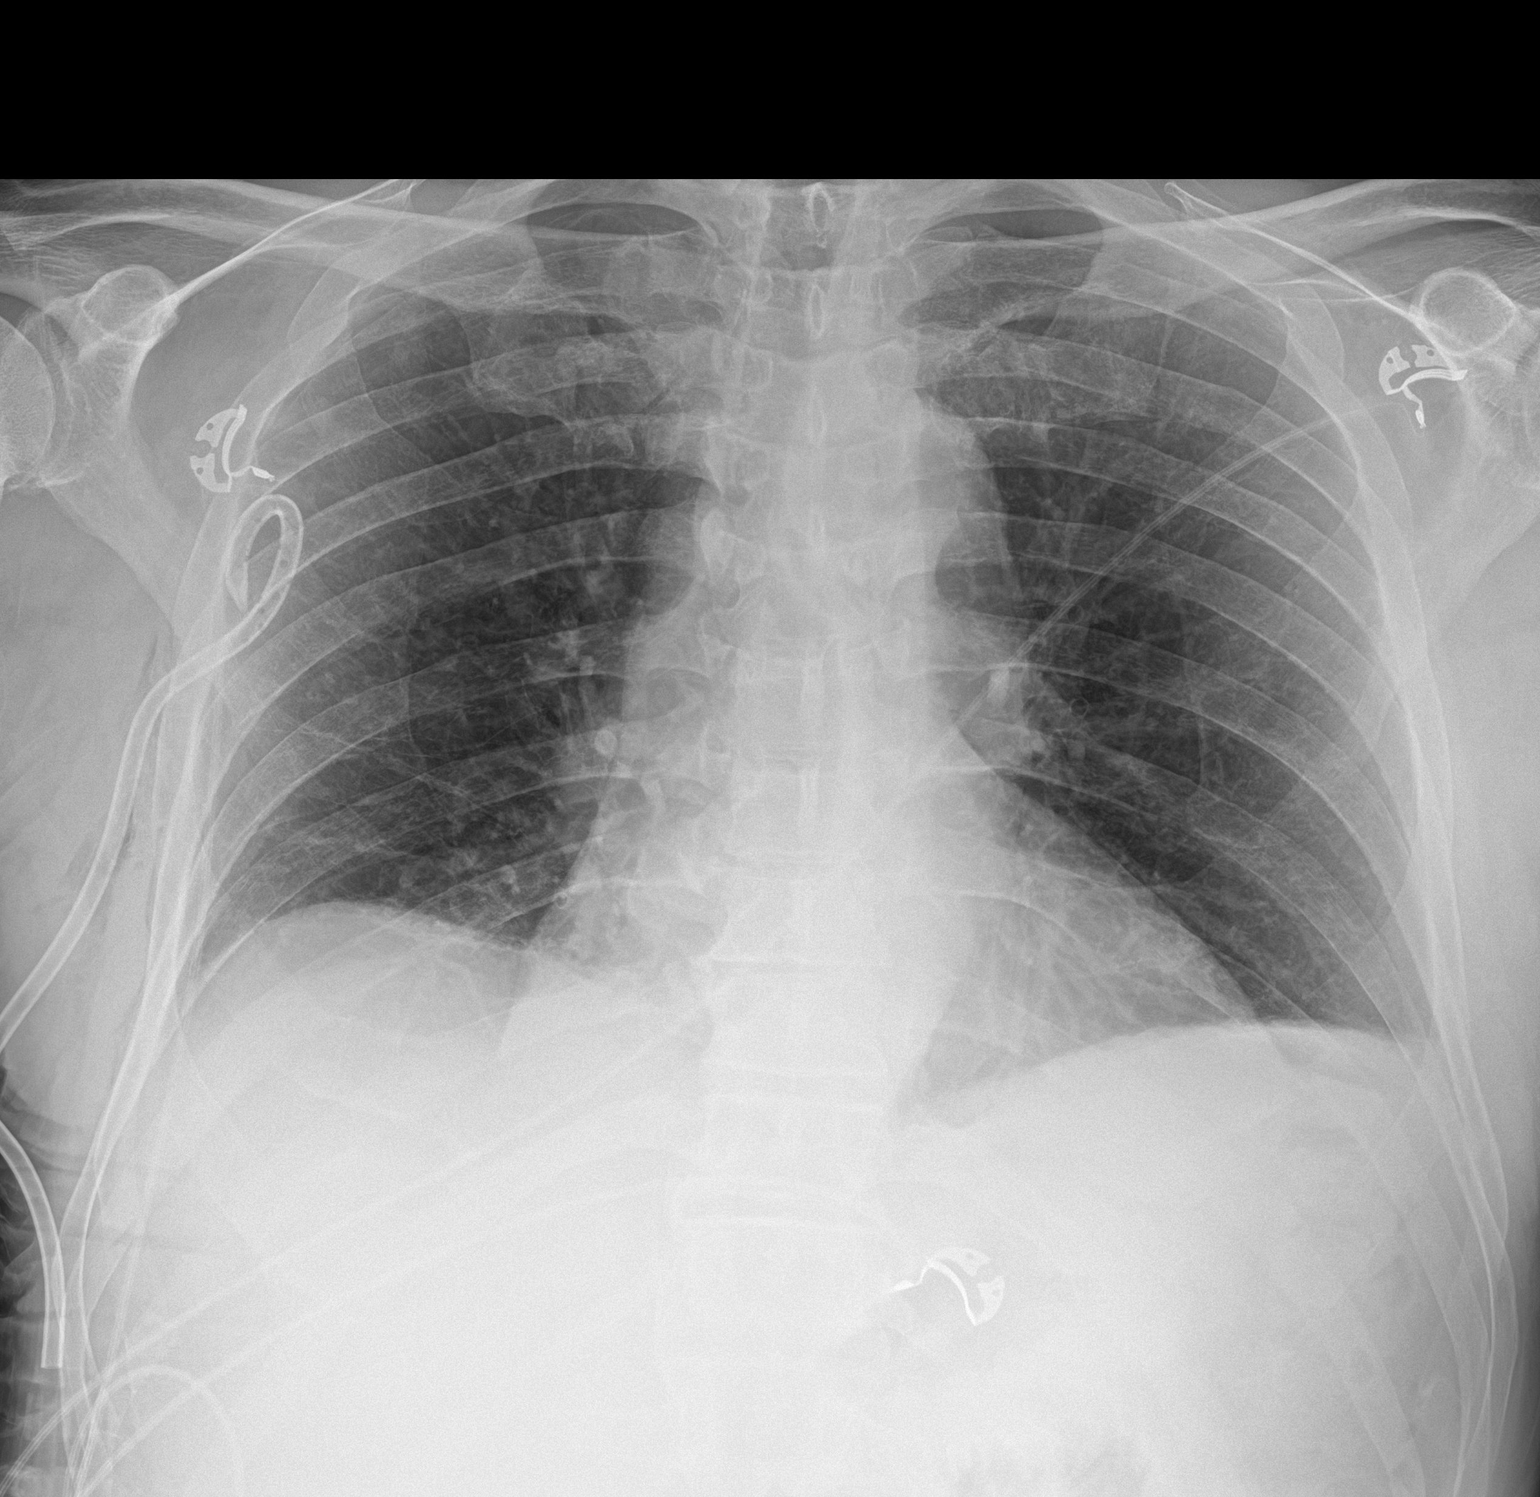

[1 of 1 positions shown; findings below may reference images not displayed]

FINDINGS: Previously noted small bore right-sided chest tube remains stable in
position in the lateral aspect of the right hemithorax. Small right
apical pneumothorax occupying less than 5% of the volume of the
right hemithorax, unchanged. Small amount of subcutaneous emphysema
in the right lateral chest wall, also similar to the prior
examination. Lung volumes are low. No consolidative airspace
disease. No pleural effusions. No evidence of pulmonary edema. Heart
size is normal. Upper mediastinal contours are within normal limits.
IMPRESSION: 1. Unchanged radiographic appearance the chest with small persistent
right-sided pneumothorax, and right-sided chest tube (stable in
position), as above.

## 2019-08-13 IMAGING — DX PORTABLE CHEST - 1 VIEW
1 series · 1 of 1 positions shown · non-contrast
Comparison: Multiple previous chest x-rays. The most recent is
05/17/2019

CLINICAL DATA: Follow-up pneumothorax.

EXAM:
PORTABLE CHEST 1 VIEW

[chest]
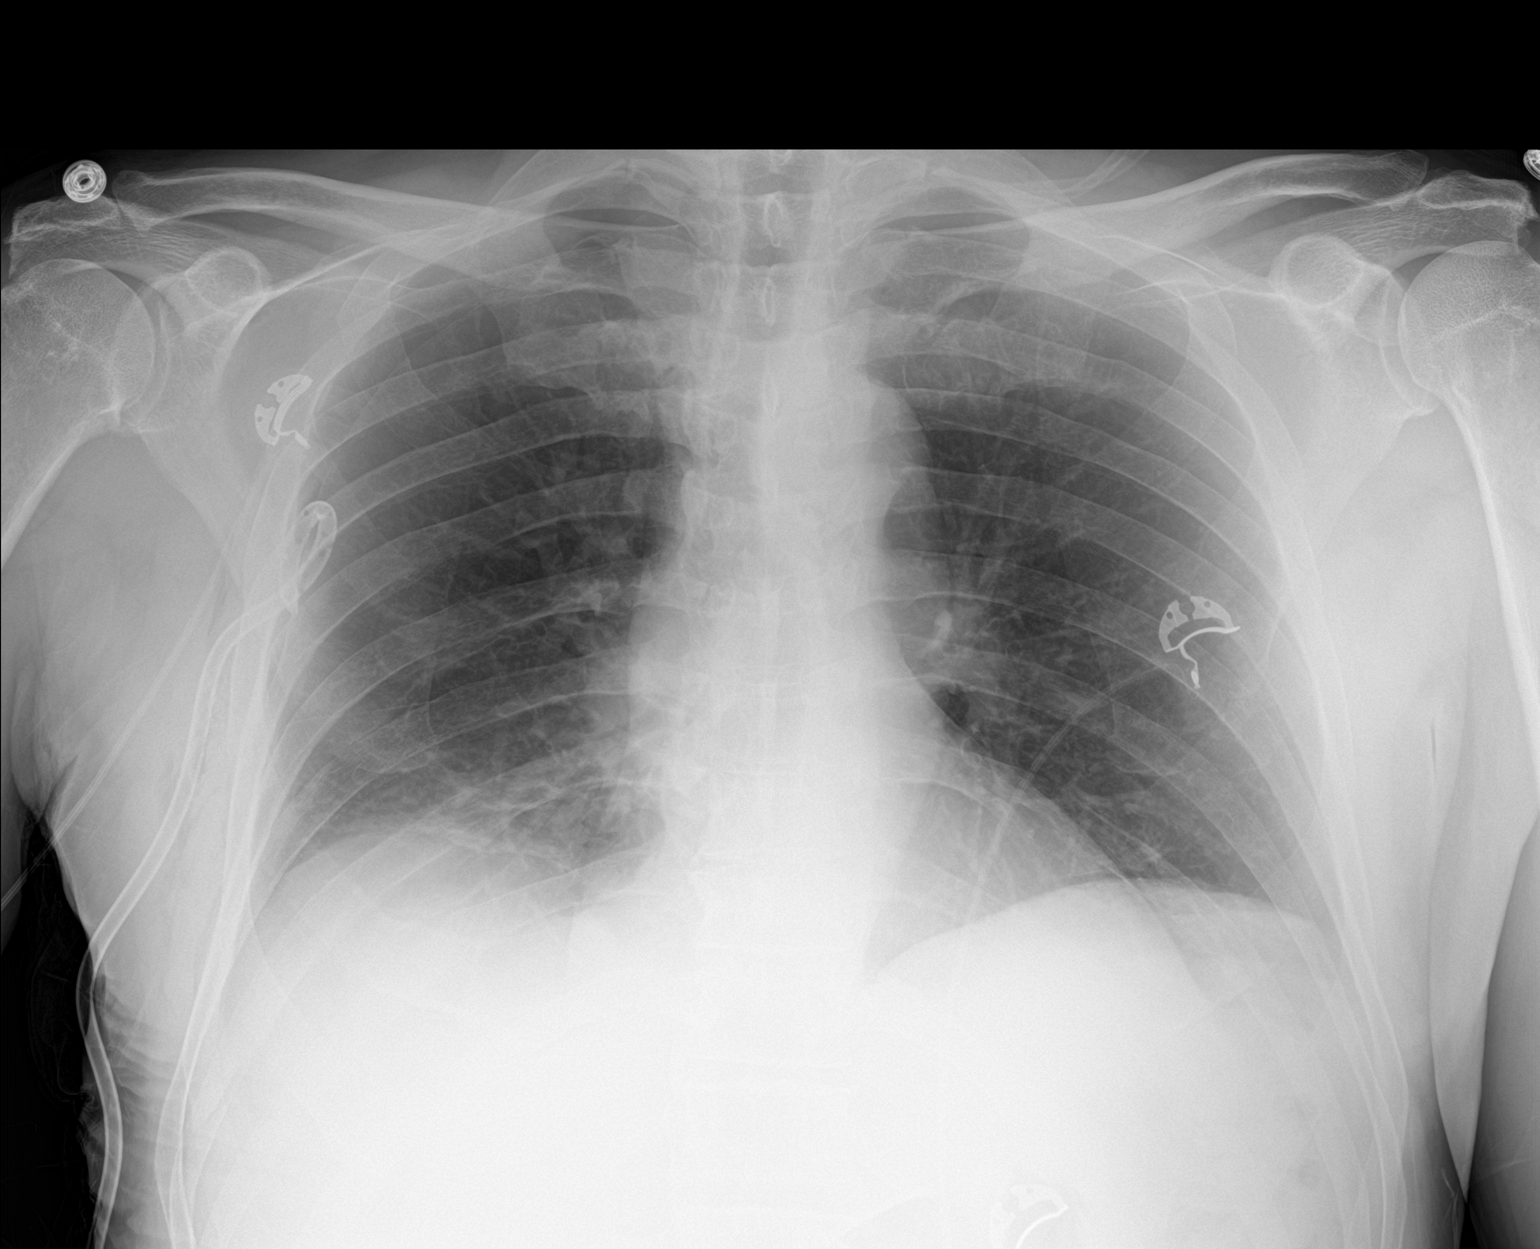

[1 of 1 positions shown; findings below may reference images not displayed]

FINDINGS: The right-sided chest tube is stable. There is a small recurrent
pneumothorax estimated at 10-15%.

Persistent streaky basilar atelectasis.
IMPRESSION: Interval progression of right-sided pneumothorax estimated at
10-15%.

## 2019-08-16 ENCOUNTER — Other Ambulatory Visit: Payer: Self-pay

## 2019-08-16 ENCOUNTER — Ambulatory Visit (INDEPENDENT_AMBULATORY_CARE_PROVIDER_SITE_OTHER): Payer: Medicare Other

## 2019-08-16 ENCOUNTER — Ambulatory Visit (INDEPENDENT_AMBULATORY_CARE_PROVIDER_SITE_OTHER): Payer: Medicare Other | Admitting: Internal Medicine

## 2019-08-16 ENCOUNTER — Emergency Department (HOSPITAL_COMMUNITY): Payer: Medicare Other

## 2019-08-16 ENCOUNTER — Inpatient Hospital Stay (HOSPITAL_COMMUNITY)
Admission: EM | Admit: 2019-08-16 | Discharge: 2019-08-21 | DRG: 163 | Disposition: A | Discharge status: Home / Self Care | Payer: Medicare Other | Source: Ambulatory Visit | Attending: Thoracic Surgery (Cardiothoracic Vascular Surgery) | Admitting: Thoracic Surgery (Cardiothoracic Vascular Surgery)

## 2019-08-16 ENCOUNTER — Encounter (HOSPITAL_COMMUNITY): Payer: Self-pay

## 2019-08-16 ENCOUNTER — Encounter: Payer: Self-pay | Admitting: Internal Medicine

## 2019-08-16 VITALS — BP 110/62 | HR 124 | Temp 97.9°F | Ht 68.5 in | Wt 181.2 lb

## 2019-08-16 DIAGNOSIS — E7800 Pure hypercholesterolemia, unspecified: Secondary | ICD-10-CM | POA: Diagnosis present

## 2019-08-16 DIAGNOSIS — I10 Essential (primary) hypertension: Secondary | ICD-10-CM | POA: Diagnosis present

## 2019-08-16 DIAGNOSIS — J439 Emphysema, unspecified: Secondary | ICD-10-CM | POA: Diagnosis present

## 2019-08-16 DIAGNOSIS — Z79899 Other long term (current) drug therapy: Secondary | ICD-10-CM | POA: Diagnosis not present

## 2019-08-16 DIAGNOSIS — J9311 Primary spontaneous pneumothorax: Secondary | ICD-10-CM | POA: Diagnosis not present

## 2019-08-16 DIAGNOSIS — Z87891 Personal history of nicotine dependence: Secondary | ICD-10-CM | POA: Diagnosis not present

## 2019-08-16 DIAGNOSIS — E785 Hyperlipidemia, unspecified: Secondary | ICD-10-CM | POA: Diagnosis present

## 2019-08-16 DIAGNOSIS — Z20828 Contact with and (suspected) exposure to other viral communicable diseases: Secondary | ICD-10-CM | POA: Diagnosis present

## 2019-08-16 DIAGNOSIS — Z96649 Presence of unspecified artificial hip joint: Secondary | ICD-10-CM | POA: Diagnosis present

## 2019-08-16 DIAGNOSIS — J939 Pneumothorax, unspecified: Secondary | ICD-10-CM | POA: Diagnosis not present

## 2019-08-16 DIAGNOSIS — J9601 Acute respiratory failure with hypoxia: Secondary | ICD-10-CM | POA: Diagnosis present

## 2019-08-16 DIAGNOSIS — Z09 Encounter for follow-up examination after completed treatment for conditions other than malignant neoplasm: Secondary | ICD-10-CM

## 2019-08-16 DIAGNOSIS — J9383 Other pneumothorax: Principal | ICD-10-CM

## 2019-08-16 DIAGNOSIS — R000 Tachycardia, unspecified: Secondary | ICD-10-CM

## 2019-08-16 DIAGNOSIS — M479 Spondylosis, unspecified: Secondary | ICD-10-CM | POA: Diagnosis present

## 2019-08-16 HISTORY — DX: Other pneumothorax: J93.83

## 2019-08-16 HISTORY — PX: CHEST TUBE INSERTION: SHX231

## 2019-08-16 LAB — BASIC METABOLIC PANEL
Anion gap: 13 (ref 5–15)
BUN: 8 mg/dL (ref 6–20)
CO2: 22 mmol/L (ref 22–32)
Calcium: 10.2 mg/dL (ref 8.9–10.3)
Chloride: 101 mmol/L (ref 98–111)
Creatinine, Ser: 1.02 mg/dL (ref 0.61–1.24)
GFR calc Af Amer: >60 mL/min (ref >60)
GFR calc non Af Amer: >60 mL/min (ref >60)
Glucose, Bld: 106 mg/dL — ABNORMAL HIGH (ref 70–99)
Potassium: 3.8 mmol/L (ref 3.5–5.1)
Sodium: 136 mmol/L (ref 135–145)

## 2019-08-16 LAB — CBC WITH DIFFERENTIAL/PLATELET
Abs Immature Granulocytes: 0.01 10*3/uL (ref 0.00–0.07)
Basophils Absolute: 0 10*3/uL (ref 0.0–0.1)
Basophils Relative: 1 %
Eosinophils Absolute: 0.1 10*3/uL (ref 0.0–0.5)
Eosinophils Relative: 2 %
HCT: 43.8 % (ref 39.0–52.0)
Hemoglobin: 14.8 g/dL (ref 13.0–17.0)
Immature Granulocytes: 0 %
Lymphocytes Relative: 29 %
Lymphs Abs: 1 10*3/uL (ref 0.7–4.0)
MCH: 31.2 pg (ref 26.0–34.0)
MCHC: 33.8 g/dL (ref 30.0–36.0)
MCV: 92.4 fL (ref 80.0–100.0)
Monocytes Absolute: 0.4 10*3/uL (ref 0.1–1.0)
Monocytes Relative: 13 %
Neutro Abs: 1.9 10*3/uL (ref 1.7–7.7)
Neutrophils Relative %: 55 %
Platelets: 237 10*3/uL (ref 150–400)
RBC: 4.74 MIL/uL (ref 4.22–5.81)
RDW: 12.7 % (ref 11.5–15.5)
WBC: 3.4 10*3/uL — ABNORMAL LOW (ref 4.0–10.5)
nRBC: 0.6 % — ABNORMAL HIGH (ref 0.0–0.2)

## 2019-08-16 LAB — PROTIME-INR
INR: 1 (ref 0.8–1.2)
Prothrombin Time: 13.1 seconds (ref 11.4–15.2)

## 2019-08-16 LAB — SARS CORONAVIRUS 2 (TAT 6-24 HRS): SARS Coronavirus 2: NEGATIVE

## 2019-08-16 MED ORDER — ACETAMINOPHEN 325 MG PO TABS
650.0000 mg | ORAL_TABLET | Freq: Four times a day (QID) | ORAL | Status: DC | PRN
  Administered 2019-08-17 (×2): 650 mg via ORAL
  Filled 2019-08-16 (×2): qty 2

## 2019-08-16 MED ORDER — LIDOCAINE HCL (PF) 1 % IJ SOLN
20.0000 mL | Freq: Once | INTRAMUSCULAR | Status: AC
  Administered 2019-08-16: 16:00:00 20 mL
  Filled 2019-08-16: qty 20

## 2019-08-16 MED ORDER — SODIUM CHLORIDE 0.9 % IV SOLN
1000.0000 mL | INTRAVENOUS | Status: DC
  Administered 2019-08-16 – 2019-08-18 (×4): 1000 mL via INTRAVENOUS

## 2019-08-16 MED ORDER — AMLODIPINE BESYLATE 5 MG PO TABS
5.0000 mg | ORAL_TABLET | Freq: Every day | ORAL | Status: DC
  Administered 2019-08-17: 09:00:00 5 mg via ORAL
  Filled 2019-08-16: qty 1

## 2019-08-16 MED ORDER — POLYETHYLENE GLYCOL 3350 17 G PO PACK: 17.0000 g | PACK | Freq: Every day | ORAL | Status: DC | PRN

## 2019-08-16 MED ORDER — ONDANSETRON HCL 4 MG/2ML IJ SOLN
4.0000 mg | Freq: Four times a day (QID) | INTRAMUSCULAR | Status: DC | PRN
  Administered 2019-08-16 – 2019-08-18 (×2): 4 mg via INTRAVENOUS
  Filled 2019-08-16: qty 2

## 2019-08-16 MED ORDER — SODIUM CHLORIDE 0.9% FLUSH
3.0000 mL | Freq: Two times a day (BID) | INTRAVENOUS | Status: DC
  Administered 2019-08-17: 3 mL via INTRAVENOUS

## 2019-08-16 MED ORDER — SODIUM CHLORIDE 0.9 % IV BOLUS (SEPSIS)
500.0000 mL | Freq: Once | INTRAVENOUS | Status: AC
  Administered 2019-08-16: 500 mL via INTRAVENOUS

## 2019-08-16 MED ORDER — FENOFIBRATE 160 MG PO TABS
160.0000 mg | ORAL_TABLET | Freq: Every day | ORAL | Status: DC
  Administered 2019-08-17: 09:00:00 160 mg via ORAL
  Filled 2019-08-16: qty 1

## 2019-08-16 MED ORDER — MORPHINE SULFATE (PF) 4 MG/ML IV SOLN
4.0000 mg | INTRAVENOUS | Status: DC | PRN
  Administered 2019-08-16 – 2019-08-17 (×6): 4 mg via INTRAVENOUS
  Filled 2019-08-16 (×6): qty 1

## 2019-08-16 MED ORDER — ACETAMINOPHEN 650 MG RE SUPP: 650.0000 mg | Freq: Four times a day (QID) | RECTAL | Status: DC | PRN

## 2019-08-16 NOTE — Patient Instructions (Signed)
ER

## 2019-08-16 NOTE — Progress Notes (Addendum)
Pulmonary f/u note  S: here for f/u of primary spontaneous PTX (July 2020) and smoking. PFTs benign.  PTX resolved after tube thoracostomy.   Doing well, stopped smoking!! Having intermittent episodes of dyspnea usually with exertion but can occur at rest. No chest pain, palpitations, shoulder pain, N/V. HR in office in 120s  ROS + symptoms in bold Fevers, chills, weight loss Nausea, vomiting, diarrhea Shortness of breath, wheezing, cough Chest pain, palpitations, lower ext edema   O:  Today's Vitals   08/16/19 1343  BP: 110/62  Pulse: (!) 124  Temp: 97.9 F (36.6 C)  TempSrc: Oral  SpO2: 97%  Weight: 181 lb 3.2 oz (82.2 kg)  Height: 5' 8.5" (1.74 m)   Body mass index is 27.15 kg/m.  GEN: thin man in NAD HEENT: mask on CV: Tachycardic, regular, ext warm PULM: Clear, no accessory muscle use, equal excursion, diminished R GI: Soft, +BS EXT: No edema NEURO: normal speech, moves all 4 ext PSYCH: AOx3, fair insight SKIN: no rashes  Office EKG: Sinus tach, LVH, prolonged QT, no ischemic changes HIV neg July 2020 SARS2 neg 05/25/19  In office CXR- recurrent R PTX, no clear tension component  A: # Recurrent spontaneous R PTX  P: - EMS to take to ER, would do tube thoracostomy with plans for surgical pleurodesis, called ER as heads up and will let CT surgery know.   Erskine Emery MD PCCM

## 2019-08-16 NOTE — ED Triage Notes (Signed)
Pt arrived via [facility] from pulmonologist office for f/u. Pt dx w/ right spontaneous pneumothorax. Pt had a spontaneous pneumothorax this past August. Pt is A&Ox4. Sinus tach on monitor. Sa02 90% on RA. Pt tachypneic. Color within normal limits.

## 2019-08-16 NOTE — ED Provider Notes (Addendum)
[place] EMERGENCY DEPARTMENT Provider Note   CSN: [id number] Arrival date & time: 08/16/19  1517     History   Chief Complaint Chief Complaint  Patient presents with  . Spontaneous Pneumothorax    HPI Jeremy Cunningham is a 58 y.o. male.     HPI Patient reports he has sudden onset of shortness of breath and chest pain while walking last night.  He had a prior history of a pneumothorax in August of this year.  Patient went to pulmonology office today and had chest x-ray performed and large right pneumothorax identified.  Patient reports he otherwise been well.  He has been not having fevers, chills, cough.  He has not been having swelling in his legs.  He has been feeling well without abdominal pain nausea or vomiting.  Patient quit smoking last August when he had his first spontaneous pneumothorax.  He was treated with chest tube with resolution.  This is the first recurrence. Past Medical History:  Diagnosis Date  . Arthritis    back  . Hyperlipidemia   . Spontaneous pneumothorax     Patient Active Problem List   Diagnosis Date Noted  . Former smoker 05/30/2019  . Pneumothorax 05/12/2019  . Hypercholesteremia 05/12/2019  . HTN (hypertension) 05/12/2019  . Hypokalemia 05/12/2019  . Atypical chest pain 05/12/2019    Past Surgical History:  Procedure Laterality Date  . TOTAL HIP ARTHROPLASTY  03/2010        Home Medications    Prior to Admission medications   Medication Sig Start Date End Date Taking? Authorizing Provider  amLODipine (NORVASC) 5 MG tablet Take 5 mg by mouth daily. 05/03/19   [provider]  fenofibrate (TRICOR) 145 MG tablet Take 145 mg by mouth daily. 05/03/19   [provider]  Ipratropium-Albuterol (COMBIVENT RESPIMAT) 20-100 MCG/ACT AERS respimat Inhale 1 puff into the lungs every 6 (six) hours as needed for wheezing or shortness of breath. 05/20/19 06/19/19  Guilford Shi, MD  Omega-3 Fatty Acids (FISH OIL  PO) Take 1 tablet by mouth daily.    [provider]    Family History Family History  Problem Relation Age of Onset  . Colon cancer Neg Hx   . Esophageal cancer Neg Hx   . Rectal cancer Neg Hx   . Stomach cancer Neg Hx     Social History Social History   Tobacco Use  . Smoking status: Former Smoker    Packs/day: 0.50    Years: 25.00    Pack years: 12.50    Types: Cigarettes    Start date: 10/29/1993    Quit date: 05/11/2019    Years since quitting: 0.2  . Smokeless tobacco: Never Used  . Tobacco comment: quit May 11, 2019  Substance Use Topics  . Alcohol use: Yes    Alcohol/week: 2.0 standard drinks    Types: 2 Cans of beer per week  . Drug use: No     Allergies   Patient has no known allergies.   Review of Systems Review of Systems 10 Systems reviewed and are negative for acute change except as noted in the HPI.   Physical Exam Updated Vital Signs BP (!) 116/92   Pulse (!) 120   Temp 98.9 F (37.2 C) (Oral)   Resp (!) 28   Ht 5\' 9"  (1.753 m)   Wt 82.1 kg   SpO2 94%   BMI 26.73 kg/m   Physical Exam Constitutional:      Comments:  Alert and appropriate.  No confusion.  Tachypnea.  Patient does not exhibit severe respiratory distress.  HENT:     Head: Normocephalic and atraumatic.     Mouth/Throat:     Mouth: Mucous membranes are moist.     Pharynx: Oropharynx is clear.  Eyes:     Extraocular Movements: Extraocular movements intact.  Cardiovascular:     Comments: Tachycardia.  No gross rub murmur gallop. Pulmonary:     Comments: Tachypnea.  Speaking in short full sentences.  Absent breath sounds on the right.  Normal breath sounds on the left. Abdominal:     General: There is no distension.     Palpations: Abdomen is soft.     Tenderness: There is no abdominal tenderness. There is no guarding.  Musculoskeletal: Normal range of motion.        General: No swelling or tenderness.     Right lower leg: No edema.     Left lower leg: No  edema.  Skin:    General: Skin is warm and dry.  Neurological:     General: No focal deficit present.     Mental Status: He is oriented to person, place, and time.     Coordination: Coordination normal.  Psychiatric:        Mood and Affect: Mood normal.      ED Treatments / Results  Labs (all labs ordered are listed, but only abnormal results are displayed) Labs Reviewed  BASIC METABOLIC PANEL - Abnormal; Notable for the following components:      Result Value   Glucose, Bld 106 (*)    All other components within normal limits  CBC WITH DIFFERENTIAL/PLATELET - Abnormal; Notable for the following components:   WBC 3.4 (*)    nRBC 0.6 (*)    All other components within normal limits  SARS CORONAVIRUS 2 (TAT 6-24 HRS)  PROTIME-INR    EKG EKG Interpretation  Date/Time:  Tuesday August 16 2019 15:21:52 EDT Ventricular Rate:  118 PR Interval:    QRS Duration: 91 QT Interval:  311 QTC Calculation: 436 R Axis:   83 Text Interpretation: Sinus tachycardia Left ventricular hypertrophy Borderline T abnormalities, anterior leads no change except tachycardia Confirmed by Charlesetta Shanks [phone]) on 08/16/2019 5:24:36 PM   Radiology Dg Chest 2 View  Result Date: 08/16/2019 CLINICAL DATA:  Shortness of breath and tachycardia EXAM: CHEST - 2 VIEW COMPARISON:  May 30, 2019 FINDINGS: The mediastinal contour and cardiac silhouette are normal. There is a large right pneumothorax with complete collapse of the right lung. The left lung is clear. Mild degenerative joint changes of the spine are noted. IMPRESSION: Large right pneumothorax with complete collapse of the right lung. These results were called by telephone at the time of interpretation on 08/16/2019 at 2:28 pm to provider [facility] , who verbally acknowledged these results. Electronically Signed   By: Abelardo Diesel M.D.   On: 08/16/2019 14:31   Dg Chest Port 1 View  Result Date: 08/16/2019 CLINICAL DATA:  Chest tube EXAM:  PORTABLE CHEST 1 VIEW COMPARISON:  Radiograph 08/16/2019 FINDINGS: Interval placement of a pigtail pleural catheter in the right mid lung. Near complete re-expansion of the right lung with small amount of residual air and linear opacities likely reflecting bandlike areas of scarring or atelectasis. Left lung remains clear.The cardiomediastinal contours are unremarkable. Degenerative changes are present in the imaged spine and shoulders. IMPRESSION: Interval placement of a pigtail pleural catheter in the right mid lung. Near complete reexpansion  of the right lung with small residual pneumothorax and linear atelectasis. Electronically Signed   By: Lovena Le M.D.   On: 08/16/2019 16:55    Procedures CHEST TUBE INSERTION  Date/Time: 08/16/2019 4:42 PM Performed by: Charlesetta Shanks, MD Authorized by: Charlesetta Shanks, MD   Consent:    Consent obtained:  Verbal   Consent given by:  Patient   Risks discussed:  Bleeding, incomplete drainage, nerve damage, pain, infection and damage to surrounding structures   Alternatives discussed:  Delayed treatment Pre-procedure details:    Skin preparation:  ChloraPrep   Preparation: Patient was prepped and draped in the usual sterile fashion   Procedure details:    Placement location:  R lateral   Scalpel size:  11   Tube size (Fr):  Minicatheter   Ultrasound guidance: no     Tension pneumothorax: no     Tube connected to:  Suction   Drainage characteristics:  Air only   Suture material:  0 silk   Dressing:  Xeroform gauze and 4x4 sterile gauze Post-procedure details:    Patient tolerance of procedure:  Tolerated well, no immediate complications Comments:     Patient tolerated procedure well.  Feels improved.  Vital signs are stable.  Awaiting postplacement chest x-ray for review.   (including critical care time) Post tube placement chest x-ray shows reexpansion of the lung with good placement of the tube.  CRITICAL CARE Performed by: Charlesetta Shanks   Total critical care time: 30  minutes  Critical care time was exclusive of separately billable procedures and treating other patients.  Critical care was necessary to treat or prevent imminent or life-threatening deterioration.  Critical care was time spent personally by me on the following activities: development of treatment plan with patient and/or surrogate as well as nursing, discussions with consultants, evaluation of patient's response to treatment, examination of patient, obtaining history from patient or surrogate, ordering and performing treatments and interventions, ordering and review of laboratory studies, ordering and review of radiographic studies, pulse oximetry and re-evaluation of patient's condition. Medications Ordered in ED Medications  morphine 4 MG/ML injection 4 mg (4 mg Intravenous Given 08/16/19 1710)  ondansetron (ZOFRAN) injection 4 mg (4 mg Intravenous Given 08/16/19 1709)  sodium chloride 0.9 % bolus 500 mL (500 mLs Intravenous New Bag/Given 08/16/19 1708)    Followed by  0.9 %  sodium chloride infusion (1,000 mLs Intravenous New Bag/Given 08/16/19 1709)  lidocaine (PF) (XYLOCAINE) 1 % injection 20 mL (20 mLs Infiltration Given by Other 08/16/19 1557)     Initial Impression / Assessment and Plan / ED Course  I have reviewed the triage vital signs and the nursing notes.  Pertinent labs & imaging results that were available during my care of the patient were reviewed by me and considered in my medical decision making (see chart for details).  Clinical Course as of Aug 15 1728  Tue Aug 16, 2019  1535 Consult: I have called CT surgery office.  They will text Dr. Mohammed Kindle to confirm desired tube size for chest tube.  Request CT scan after chest tube was placed.   [MP]  [facility] surgery advises that pigtail catheter is fine.  Request admission to pulmonology service with consultation to CT surgery.   [MP]  1720 Consult: Reviewed with Dr. Vaughan Browner of  critical care.  He reports appropriate for hospitalist admission.  He reports he had already reviewed this with referring pulmonology Dr. Olena Heckle.   [MP]  1728 Consult: Reviewed  with Dr. Maylene Roes tried hospitalist for admission.   [MP]    Clinical Course User Index [MP] Charlesetta Shanks, MD      Patient presents with referral from pulmonology office with known spontaneous pneumothorax.  Patient was alert and appropriate.  Tachypneic but stable respiratory status.  Pigtail chest tube placed by myself.  Plan for admission per review with CT surgery and critical care.  Final Clinical Impressions(s) / ED Diagnoses   Final diagnoses:  Spontaneous pneumothorax    ED Discharge Orders    None       Charlesetta Shanks, MD 08/16/19 1727    Charlesetta Shanks, MD 08/16/19 1730

## 2019-08-16 NOTE — Consult Note (Signed)
NAME:  Jeremy Cunningham, MRN:  [id number], DOB:  07/12/1961, LOS: 0 ADMISSION DATE:  08/16/2019, CONSULTATION DATE:  10/27  REFERRING MD:  Dr. Johnney Killian, CHIEF COMPLAINT:  ptx   Brief History   58 year old male with recent admission for spontaneous pneumothorax, now presenting 10/27 from pulmonary office with recurrent pneumothorax.  History of present illness   58 year old male with PMH as below, which is significant for HTN, recent former smoker, and recent spontaneous right sided pneumothorax for which he was admitted in in late July. This resolved with chest tube decompression alone. He was inpatient for 8 days and discharged on 7/31. He presented to pulmonary clinic on 10/27 with complaints of occasional exertional dyspnea. He was found to be tachycardic and CXR was done, describing right sided pneumothorax. EMS was called and he was transferred to [facility] ED where small bore chest tube was placed. PCCM asked to exaluate.   Past Medical History   has a past medical history of Arthritis, Hyperlipidemia, and Spontaneous pneumothorax.   Significant Hospital Events   10/27 admit with chest tube placed in ED.   Consults:  PCCM CVTS  Procedures:  Chest tube 10/27 >  Significant Diagnostic Tests:  05/30/2019-pulmonary function test-FVC 3.68 (95% predicted), postbronchodilator ratio 75, postbronchodilator FEV1 2.87 (94% predicted), no bronchodilator response, DLCO 24.35 (90% predicted) CT chest 10/27 > Right-sided pigtail chest tube in place with small residual Pneumothorax. Mild re-expansion pulmonary edema and subsegmental atelectasis throughout the right lung.   Micro Data:    Antimicrobials:    Interim history/subjective:    Objective   Blood pressure (!) 116/92, pulse (!) 120, temperature 98.9 F (37.2 C), temperature source Oral, resp. rate (!) 28, height 5\' 9"  (1.753 m), weight 82.1 kg, SpO2 94 %.       No intake or output data in the 24 hours ending 08/16/19 1736 Filed  Weights   08/16/19 1526  Weight: 82.1 kg    Examination: General: middle aged adult male in NAD HENT: /AT, PERRL, no JVD Lungs: Clear bilateral breath sounds. Diminished in the bases Cardiovascular: RRR, no MRG Abdomen: Soft, non-tender, non-distended Extremities: No acute deformity Neuro: Alert, oriented, non-focal  Resolved Hospital Problem list     Assessment & Plan:   R sided pneumothorax: unclear if this is true spontaneous pneumothorax as there were some bullous changes noted on CT from previous admission.  - Chest tube to 20cm H2O suction - flush tube per protocol - Repeat CXR in AM - Recommend CVTS consultation regarding pleurodesis.  - Supplemental oxygen as indicated to keep O2 sats > 95%  Possible COPD: smoking history with bullous changes on CT. PFTs reassuring. Likely emphysema. - PRN nebs only.  - Pulmonary follow up   Best practice:  Diet: per primary Pain/Anxiety/Delirium protocol (if indicated): NA VAP protocol (if indicated): NA DVT prophylaxis: per primary GI prophylaxis: Per primary Glucose control: per primary Mobility: BR Code Status: FULL Family Communication: patient updated in ED Disposition: Tele/TRH  Labs   CBC: Recent Labs  Lab 08/16/19 1526  WBC 3.4*  NEUTROABS 1.9  HGB 14.8  HCT 43.8  MCV 92.4  PLT 123XX123    Basic Metabolic Panel: Recent Labs  Lab 08/16/19 1526  NA 136  K 3.8  CL 101  CO2 22  GLUCOSE 106*  BUN 8  CREATININE 1.02  CALCIUM 10.2   GFR: Estimated Creatinine Clearance: 80.9 mL/min (by C-G formula based on SCr of 1.02 mg/dL). Recent Labs  Lab  08/16/19 1526  WBC 3.4*    Liver Function Tests: No results for input(s): AST, ALT, ALKPHOS, BILITOT, PROT, ALBUMIN in the last 168 hours. No results for input(s): LIPASE, AMYLASE in the last 168 hours. No results for input(s): AMMONIA in the last 168 hours.  ABG No results found for: PHART, PCO2ART, PO2ART, HCO3, TCO2, ACIDBASEDEF, O2SAT   Coagulation  Profile: Recent Labs  Lab 08/16/19 1526  INR 1.0    Cardiac Enzymes: No results for input(s): CKTOTAL, CKMB, CKMBINDEX, TROPONINI in the last 168 hours.  HbA1C: No results found for: HGBA1C  CBG: No results for input(s): GLUCAP in the last 168 hours.  Review of Systems:   Bolds are positive  Constitutional: weight loss, gain, night sweats, Fevers, chills, fatigue .  HEENT: headaches, Sore throat, sneezing, nasal congestion, post nasal drip, Difficulty swallowing, Tooth/dental problems, visual complaints visual changes, ear ache CV:  chest pain, radiates:,Orthopnea, PND, swelling in lower extremities, dizziness, palpitations, syncope.  GI  heartburn, indigestion, abdominal pain, nausea, vomiting, diarrhea, change in bowel habits, loss of appetite, bloody stools.  Resp: cough, productive:, hemoptysis, dyspnea, chest pain, pleuritic.  Skin: rash or itching or icterus GU: dysuria, change in color of urine, urgency or frequency. flank pain, hematuria  MS: joint pain or swelling. decreased range of motion  Psych: change in mood or affect. depression or anxiety.  Neuro: difficulty with speech, weakness, numbness, ataxia    Past Medical History  He,  has a past medical history of Arthritis, Hyperlipidemia, and Spontaneous pneumothorax.   Surgical History    Past Surgical History:  Procedure Laterality Date  . TOTAL HIP ARTHROPLASTY  03/2010     Social History   reports that he quit smoking about 3 months ago. His smoking use included cigarettes. He started smoking about 25 years ago. He has a 12.50 pack-year smoking history. He has never used smokeless tobacco. He reports current alcohol use of about 2.0 standard drinks of alcohol per week. He reports that he does not use drugs.   Family History   His family history is negative for Colon cancer, Esophageal cancer, Rectal cancer, and Stomach cancer.   Allergies No Known Allergies   Home Medications  Prior to Admission  medications   Medication Sig Start Date End Date Taking? Authorizing Provider  amLODipine (NORVASC) 5 MG tablet Take 5 mg by mouth daily. 05/03/19   [provider]  fenofibrate (TRICOR) 145 MG tablet Take 145 mg by mouth daily. 05/03/19   [provider]  Ipratropium-Albuterol (COMBIVENT RESPIMAT) 20-100 MCG/ACT AERS respimat Inhale 1 puff into the lungs every 6 (six) hours as needed for wheezing or shortness of breath. 05/20/19 06/19/19  Guilford Shi, MD  Omega-3 Fatty Acids (FISH OIL PO) Take 1 tablet by mouth daily.    [provider]     Georgann Housekeeper, AGACNP-BC [place] pager in [place] or after hours page [phone]  08/16/2019 5:52 PM

## 2019-08-16 NOTE — H&P (Signed)
History and Physical    Jeremy Cunningham [id number] DOB: Oct 12, 1961 DOA: 08/16/2019  PCP: Vonna Drafts, FNP  Patient coming from: Pulmonology office   Chief Complaint: Shortness of breath  HPI: Jeremy Cunningham is a 58 y.o. male with medical history significant of spontaneous pneumothorax in July, HLD, HTN. He was at this pulmonologist's office today, CXR revealed large right pneumothorax. He was sent to the ED for treatment. He states that he has been in good health since his previous pneumothorax. He was walking when he started feeling short of breath again. Complains of central chest pain. No fevers, chills, N/V/abdominal pain.    ED Course: Chest tube placed in the ED. CT surgery, PCCM consulted. CT chest pending at time of admission.   Review of Systems: As per HPI. Otherwise, all other review of systems reviewed and are negative.   Past Medical History:  Diagnosis Date  . Arthritis    back  . Hyperlipidemia   . Spontaneous pneumothorax     Past Surgical History:  Procedure Laterality Date  . TOTAL HIP ARTHROPLASTY  03/2010     reports that he quit smoking about 3 months ago. His smoking use included cigarettes. He started smoking about 25 years ago. He has a 12.50 pack-year smoking history. He has never used smokeless tobacco. He reports current alcohol use of about 2.0 standard drinks of alcohol per week. He reports that he does not use drugs.  No Known Allergies  Family History  Problem Relation Age of Onset  . Colon cancer Neg Hx   . Esophageal cancer Neg Hx   . Rectal cancer Neg Hx   . Stomach cancer Neg Hx      Prior to Admission medications   Medication Sig Start Date End Date Taking? Authorizing Provider  amLODipine (NORVASC) 5 MG tablet Take 5 mg by mouth daily. 05/03/19   [provider]  fenofibrate (TRICOR) 145 MG tablet Take 145 mg by mouth daily. 05/03/19   [provider]  Ipratropium-Albuterol (COMBIVENT RESPIMAT) 20-100 MCG/ACT AERS  respimat Inhale 1 puff into the lungs every 6 (six) hours as needed for wheezing or shortness of breath. 05/20/19 06/19/19  Guilford Shi, MD  Omega-3 Fatty Acids (FISH OIL PO) Take 1 tablet by mouth daily.    [provider]    Physical Exam: Vitals:   08/16/19 1526 08/16/19 1530 08/16/19 1640 08/16/19 1746  BP:  (!) 116/92 (!) 109/91 120/88  Pulse:  (!) 120 (!) 111 (!) 107  Resp:  (!) 28 (!) 33 19  Temp:      TempSrc:      SpO2:  94% 97% 93%  Weight: 82.1 kg     Height: 5\' 9"  (1.753 m)        Constitutional: NAD, calm, comfortable Eyes: PERRL, lids and conjunctivae normal ENMT: Mucous membranes are moist. Normal dentition.  Respiratory: Clear to auscultation bilaterally, no wheezing, no crackles. Normal respiratory effort. No accessory muscle use. No conversational dyspnea. On 2L  O2  Cardiovascular: Tachycardic rate and regular rhythm, no murmurs. No extremity edema.  Abdomen: Soft, nondistended, nontender to palpation. Bowel sounds positive.  Musculoskeletal: No joint deformity upper and lower extremities. No contractures. Normal muscle tone.  Skin: no rashes, lesions, ulcers on exposed skin  Neurologic: Alert and oriented, speech fluent, CN 2-12 grossly intact. No focal deficits.   Psychiatric: Normal judgment and insight. Normal mood and affect   Labs on Admission: I have personally reviewed following labs and imaging studies  CBC: Recent Labs  Lab 08/16/19 1526  WBC 3.4*  NEUTROABS 1.9  HGB 14.8  HCT 43.8  MCV 92.4  PLT 123XX123   Basic Metabolic Panel: Recent Labs  Lab 08/16/19 1526  NA 136  K 3.8  CL 101  CO2 22  GLUCOSE 106*  BUN 8  CREATININE 1.02  CALCIUM 10.2   GFR: Estimated Creatinine Clearance: 80.9 mL/min (by C-G formula based on SCr of 1.02 mg/dL). Liver Function Tests: No results for input(s): AST, ALT, ALKPHOS, BILITOT, PROT, ALBUMIN in the last 168 hours. No results for input(s): LIPASE, AMYLASE in the last 168 hours. No results  for input(s): AMMONIA in the last 168 hours. Coagulation Profile: Recent Labs  Lab 08/16/19 1526  INR 1.0   Cardiac Enzymes: No results for input(s): CKTOTAL, CKMB, CKMBINDEX, TROPONINI in the last 168 hours. BNP (last 3 results) No results for input(s): PROBNP in the last 8760 hours. HbA1C: No results for input(s): HGBA1C in the last 72 hours. CBG: No results for input(s): GLUCAP in the last 168 hours. Lipid Profile: No results for input(s): CHOL, HDL, LDLCALC, TRIG, CHOLHDL, LDLDIRECT in the last 72 hours. Thyroid Function Tests: No results for input(s): TSH, T4TOTAL, FREET4, T3FREE, THYROIDAB in the last 72 hours. Anemia Panel: No results for input(s): VITAMINB12, FOLATE, FERRITIN, TIBC, IRON, RETICCTPCT in the last 72 hours. Urine analysis:    Component Value Date/Time   COLORURINE YELLOW 03/14/2010 0820   APPEARANCEUR CLEAR 03/14/2010 0820   LABSPEC 1.015 03/14/2010 0820   PHURINE 5.0 03/14/2010 0820   GLUCOSEU NEGATIVE 03/14/2010 0820   HGBUR NEGATIVE 03/14/2010 0820   BILIRUBINUR NEGATIVE 03/14/2010 0820   KETONESUR NEGATIVE 03/14/2010 0820   PROTEINUR NEGATIVE 03/14/2010 0820   UROBILINOGEN 0.2 03/14/2010 0820   NITRITE NEGATIVE 03/14/2010 0820   LEUKOCYTESUR SMALL (A) 03/14/2010 0820   Sepsis Labs: !!!!!!!!!!!!!!!!!!!!!!!!!!!!!!!!!!!!!!!!!!!! @LABRCNTIP (procalcitonin:4,lacticidven:4) )No results found for this or any previous visit (from the past 240 hour(s)).   Radiological Exams on Admission: Dg Chest 2 View  Result Date: 08/16/2019 CLINICAL DATA:  Shortness of breath and tachycardia EXAM: CHEST - 2 VIEW COMPARISON:  May 30, 2019 FINDINGS: The mediastinal contour and cardiac silhouette are normal. There is a large right pneumothorax with complete collapse of the right lung. The left lung is clear. Mild degenerative joint changes of the spine are noted. IMPRESSION: Large right pneumothorax with complete collapse of the right lung. These results were called by  telephone at the time of interpretation on 08/16/2019 at 2:28 pm to provider [facility] , who verbally acknowledged these results. Electronically Signed   By: Abelardo Diesel M.D.   On: 08/16/2019 14:31   Dg Chest Port 1 View  Result Date: 08/16/2019 CLINICAL DATA:  Chest tube EXAM: PORTABLE CHEST 1 VIEW COMPARISON:  Radiograph 08/16/2019 FINDINGS: Interval placement of a pigtail pleural catheter in the right mid lung. Near complete re-expansion of the right lung with small amount of residual air and linear opacities likely reflecting bandlike areas of scarring or atelectasis. Left lung remains clear.The cardiomediastinal contours are unremarkable. Degenerative changes are present in the imaged spine and shoulders. IMPRESSION: Interval placement of a pigtail pleural catheter in the right mid lung. Near complete reexpansion of the right lung with small residual pneumothorax and linear atelectasis. Electronically Signed   By: Lovena Le M.D.   On: 08/16/2019 16:55    EKG: Independently reviewed. Sinus tachycardia rate 118, with LVH   Assessment/Plan Principal Problem:   Recurrent pneumothorax Active Problems:   Hypercholesteremia  HTN (hypertension)   Former smoker   Recurrent right-sided spontaneous pneumothorax  -Chest tube placed in the ED -Repeat CXR showed near complete re-expansion of right lung with small residual pneumothorax and linear atelectasis  -PCCM and cardiothoracic surgery aware of patient  -Chest tube management per consulting services  -CT chest pending -Continuous pulse ox   Acute hypoxemic respiratory failure -[place] O2 as needed to maintain sats    HTN  -Norvasc  HLD -Tricor   Former smoker  -Continue encouragement of cessation    DVT prophylaxis: SCD Code Status: Full  Family Communication: None at bedside  Disposition Plan: Pending work up and stabilization of his primary problem  Consults called: CT surgery and PCCM called by EDP   Admission status:  Inpatient    Severity of Illness: * I certify that at the point of admission it is my clinical judgment that the patient will require inpatient hospital care spanning beyond 2 midnights from the point of admission due to high intensity of service, high risk for further deterioration and high frequency of surveillance required.Dessa Phi, DO [facility] 08/16/2019, 5:55 PM   Available via Epic secure chat 7am-7pm After these hours, please refer to coverage provider listed on [email or web address]

## 2019-08-17 ENCOUNTER — Inpatient Hospital Stay (HOSPITAL_COMMUNITY): Payer: Medicare Other

## 2019-08-17 DIAGNOSIS — J9601 Acute respiratory failure with hypoxia: Secondary | ICD-10-CM

## 2019-08-17 DIAGNOSIS — I10 Essential (primary) hypertension: Secondary | ICD-10-CM

## 2019-08-17 DIAGNOSIS — J9311 Primary spontaneous pneumothorax: Secondary | ICD-10-CM

## 2019-08-17 DIAGNOSIS — J939 Pneumothorax, unspecified: Secondary | ICD-10-CM | POA: Diagnosis not present

## 2019-08-17 LAB — COMPREHENSIVE METABOLIC PANEL
ALT: 25 U/L (ref 0–44)
AST: 29 U/L (ref 15–41)
Albumin: 3.2 g/dL — ABNORMAL LOW (ref 3.5–5.0)
Alkaline Phosphatase: 45 U/L (ref 38–126)
Anion gap: 8 (ref 5–15)
BUN: 9 mg/dL (ref 6–20)
CO2: 22 mmol/L (ref 22–32)
Calcium: 9 mg/dL (ref 8.9–10.3)
Chloride: 109 mmol/L (ref 98–111)
Creatinine, Ser: 0.91 mg/dL (ref 0.61–1.24)
GFR calc Af Amer: >60 mL/min (ref >60)
GFR calc non Af Amer: >60 mL/min (ref >60)
Glucose, Bld: 104 mg/dL — ABNORMAL HIGH (ref 70–99)
Potassium: 4.2 mmol/L (ref 3.5–5.1)
Sodium: 139 mmol/L (ref 135–145)
Total Bilirubin: 0.5 mg/dL (ref 0.3–1.2)
Total Protein: 5.8 g/dL — ABNORMAL LOW (ref 6.5–8.1)

## 2019-08-17 LAB — TYPE AND SCREEN
ABO/RH(D): B POS
Antibody Screen: NEGATIVE

## 2019-08-17 LAB — CBC
HCT: 38.1 % — ABNORMAL LOW (ref 39.0–52.0)
HCT: 36.8 % — ABNORMAL LOW (ref 39.0–52.0)
Hemoglobin: 12.6 g/dL — ABNORMAL LOW (ref 13.0–17.0)
Hemoglobin: 11.8 g/dL — ABNORMAL LOW (ref 13.0–17.0)
MCH: 32.1 pg (ref 26.0–34.0)
MCH: 31.2 pg (ref 26.0–34.0)
MCHC: 33.1 g/dL (ref 30.0–36.0)
MCHC: 32.1 g/dL (ref 30.0–36.0)
MCV: 96.9 fL (ref 80.0–100.0)
MCV: 97.4 fL (ref 80.0–100.0)
Platelets: 208 10*3/uL (ref 150–400)
Platelets: 228 10*3/uL (ref 150–400)
RBC: 3.93 MIL/uL — ABNORMAL LOW (ref 4.22–5.81)
RBC: 3.78 MIL/uL — ABNORMAL LOW (ref 4.22–5.81)
RDW: 13.1 % (ref 11.5–15.5)
RDW: 13 % (ref 11.5–15.5)
WBC: 3.4 10*3/uL — ABNORMAL LOW (ref 4.0–10.5)
WBC: 3.1 10*3/uL — ABNORMAL LOW (ref 4.0–10.5)
nRBC: 0.6 % — ABNORMAL HIGH (ref 0.0–0.2)
nRBC: 0 % (ref 0.0–0.2)

## 2019-08-17 LAB — BASIC METABOLIC PANEL
Anion gap: 8 (ref 5–15)
BUN: 10 mg/dL (ref 6–20)
CO2: 24 mmol/L (ref 22–32)
Calcium: 9 mg/dL (ref 8.9–10.3)
Chloride: 103 mmol/L (ref 98–111)
Creatinine, Ser: 1 mg/dL (ref 0.61–1.24)
GFR calc Af Amer: >60 mL/min (ref >60)
GFR calc non Af Amer: >60 mL/min (ref >60)
Glucose, Bld: 91 mg/dL (ref 70–99)
Potassium: 3.9 mmol/L (ref 3.5–5.1)
Sodium: 135 mmol/L (ref 135–145)

## 2019-08-17 LAB — PROTIME-INR
INR: 1 (ref 0.8–1.2)
Prothrombin Time: 12.7 seconds (ref 11.4–15.2)

## 2019-08-17 LAB — APTT: aPTT: 27 seconds (ref 24–36)

## 2019-08-17 LAB — ABO/RH: ABO/RH(D): B POS

## 2019-08-17 MED ORDER — CEFAZOLIN SODIUM-DEXTROSE 2-4 GM/100ML-% IV SOLN
2.0000 g | INTRAVENOUS | Status: AC
  Administered 2019-08-18: 2 g via INTRAVENOUS
  Filled 2019-08-17: qty 100

## 2019-08-17 MED ORDER — MORPHINE SULFATE (PF) 4 MG/ML IV SOLN
4.0000 mg | INTRAVENOUS | Status: DC | PRN
  Administered 2019-08-17 (×3): 4 mg via INTRAVENOUS
  Filled 2019-08-17 (×4): qty 1

## 2019-08-17 MED ORDER — IPRATROPIUM-ALBUTEROL 0.5-2.5 (3) MG/3ML IN SOLN: 3.0000 mL | Freq: Four times a day (QID) | RESPIRATORY_TRACT | Status: DC | PRN

## 2019-08-17 MED ORDER — HYDROCODONE-ACETAMINOPHEN 5-325 MG PO TABS
1.0000 | ORAL_TABLET | Freq: Four times a day (QID) | ORAL | Status: DC | PRN
  Administered 2019-08-17 – 2019-08-18 (×3): 2 via ORAL
  Filled 2019-08-17 (×4): qty 2

## 2019-08-17 NOTE — H&P (View-Only) (Signed)
Reason for Consult:Recurrent spontaneous pneumothorax Referring Physician: CCM  Orland Cariker is an 58 y.o. male.  HPI: 58 yo man presented with c/o Right sided CP and SOB  Mr. Kirker is a 58 yo man with a past history of tobacco abuse, hyperlipidemia, hypertension and back pain. He had a right spontaneous pneumothorax in late July 2020. Treated with chest tube and leak resolved. Went to Pulmonary yesterday with right sided CP and shortness of breath. Snet ot ED. CXR showed a 100% right pneumothorax. A pigtail catheter was placed with resolution of pneumothorax. No air leak at present.  Quit smoking 2 months ago after first pneumothorax. 1/2 ppd x 35 years.  Past Medical History:  Diagnosis Date  . Arthritis    back  . Hyperlipidemia   . Spontaneous pneumothorax     Past Surgical History:  Procedure Laterality Date  . TOTAL HIP ARTHROPLASTY  03/2010    Family History  Problem Relation Age of Onset  . Colon cancer Neg Hx   . Esophageal cancer Neg Hx   . Rectal cancer Neg Hx   . Stomach cancer Neg Hx     Social History:  reports that he quit smoking about 3 months ago. His smoking use included cigarettes. He started smoking about 25 years ago. He has a 12.50 pack-year smoking history. He has never used smokeless tobacco. He reports current alcohol use of about 2.0 standard drinks of alcohol per week. He reports that he does not use drugs.  Allergies: No Known Allergies  Medications:  Prior to Admission:  Medications Prior to Admission  Medication Sig Dispense Refill Last Dose  . amLODipine (NORVASC) 5 MG tablet Take 5 mg by mouth daily.   08/16/2019 at am  . fenofibrate (TRICOR) 145 MG tablet Take 145 mg by mouth daily.   08/16/2019 at am  . omega-3 acid ethyl esters (LOVAZA) 1 g capsule Take 1 capsule by mouth daily.    08/16/2019 at am  . traMADol (ULTRAM) 50 MG tablet Take 50 mg by mouth 2 (two) times daily as needed for pain.   Past Week at Unknown time  . triamcinolone cream  (KENALOG) 0.5 % Apply 1 application topically 2 (two) times daily as needed (for rashes).    unk at [employer]  . Ipratropium-Albuterol (COMBIVENT RESPIMAT) 20-100 MCG/ACT AERS respimat Inhale 1 puff into the lungs every 6 (six) hours as needed for wheezing or shortness of breath. (Patient not taking: Reported on 08/16/2019) 4 g 1 Not Taking at Unknown time    Results for orders placed or performed during the hospital encounter of 08/16/19 (from the past 48 hour(s))  Basic metabolic panel     Status: Abnormal   Collection Time: 08/16/19  3:26 PM  Result Value Ref Range   Sodium 136 135 - 145 mmol/L   Potassium 3.8 3.5 - 5.1 mmol/L   Chloride 101 98 - 111 mmol/L   CO2 22 22 - 32 mmol/L   Glucose, Bld 106 (H) 70 - 99 mg/dL   BUN 8 6 - 20 mg/dL   Creatinine, Ser 1.02 0.61 - 1.24 mg/dL   Calcium 10.2 8.9 - 10.3 mg/dL   GFR calc non Af Amer >60 >60 mL/min   GFR calc Af Amer >60 >60 mL/min   Anion gap 13 5 - 15    Comment: Performed at [facility], [place] [address] [place], [place] [address]  CBC with Differential     Status: Abnormal   Collection Time: 08/16/19  3:26 PM  Result Value Ref Range   WBC 3.4 (L) 4.0 - 10.5 K/uL   RBC 4.74 4.22 - 5.81 MIL/uL   Hemoglobin 14.8 13.0 - 17.0 g/dL   HCT 43.8 39.0 - 52.0 %   MCV 92.4 80.0 - 100.0 fL   MCH 31.2 26.0 - 34.0 pg   MCHC 33.8 30.0 - 36.0 g/dL   RDW 12.7 11.5 - 15.5 %   Platelets 237 150 - 400 K/uL   nRBC 0.6 (H) 0.0 - 0.2 %   Neutrophils Relative % 55 %   Neutro Abs 1.9 1.7 - 7.7 K/uL   Lymphocytes Relative 29 %   Lymphs Abs 1.0 0.7 - 4.0 K/uL   Monocytes Relative 13 %   Monocytes Absolute 0.4 0.1 - 1.0 K/uL   Eosinophils Relative 2 %   Eosinophils Absolute 0.1 0.0 - 0.5 K/uL   Basophils Relative 1 %   Basophils Absolute 0.0 0.0 - 0.1 K/uL   Immature Granulocytes 0 %   Abs Immature Granulocytes 0.01 0.00 - 0.07 K/uL    Comment: Performed at [facility], [address], [place], [place] [address]  Protime-INR     Status:  None   Collection Time: 08/16/19  3:26 PM  Result Value Ref Range   Prothrombin Time 13.1 11.4 - 15.2 seconds   INR 1.0 0.8 - 1.2    Comment: (NOTE) INR goal varies based on device and disease states. Performed at [facility], [place] [address] [place], [place] [address]   SARS CORONAVIRUS 2 (TAT 6-24 HRS) Nasopharyngeal Nasopharyngeal Swab     Status: None   Collection Time: 08/16/19  3:40 PM   Specimen: Nasopharyngeal Swab  Result Value Ref Range   SARS Coronavirus 2 NEGATIVE NEGATIVE    Comment: (NOTE) SARS-CoV-2 target nucleic acids are NOT DETECTED. The SARS-CoV-2 RNA is generally detectable in upper and lower respiratory specimens during the acute phase of infection. Negative results do not preclude SARS-CoV-2 infection, do not rule out co-infections with other pathogens, and should not be used as the sole basis for treatment or other patient management decisions. Negative results must be combined with clinical observations, patient history, and epidemiological information. The expected result is Negative. Fact Sheet for Patients: SugarRoll.be Fact Sheet for Healthcare Providers: https://www.woods-mathews.com/ This test is not yet approved or cleared by the [place] FDA and  has been authorized for detection and/or diagnosis of SARS-CoV-2 by FDA under an Emergency Use Authorization (EUA). This EUA will remain  in effect (meaning this test can be used) for the duration of the COVID-19 declaration under Section 56 4(b)(1) of the Act, 21 U.S.C. section 360bbb-3(b)(1), unless the authorization is terminated or revoked sooner. Performed at [facility], [place] [address], [place], [place] [address]   CBC     Status: Abnormal   Collection Time: 08/17/19  3:17 AM  Result Value Ref Range   WBC 3.4 (L) 4.0 - 10.5 K/uL   RBC 3.93 (L) 4.22 - 5.81 MIL/uL   Hemoglobin 12.6 (L) 13.0 - 17.0 g/dL   HCT 38.1 (L) 39.0 - 52.0 %    MCV 96.9 80.0 - 100.0 fL   MCH 32.1 26.0 - 34.0 pg   MCHC 33.1 30.0 - 36.0 g/dL   RDW 13.1 11.5 - 15.5 %   Platelets 208 150 - 400 K/uL   nRBC 0.6 (H) 0.0 - 0.2 %    Comment: Performed at [facility], [place]  [address] [place], [place] [id number]  Basic metabolic panel     Status: None   Collection Time: 08/17/19  3:17 AM  Result Value Ref Range   Sodium 135 135 - 145 mmol/L   Potassium 3.9 3.5 - 5.1 mmol/L   Chloride 103 98 - 111 mmol/L   CO2 24 22 - 32 mmol/L   Glucose, Bld 91 70 - 99 mg/dL   BUN 10 6 - 20 mg/dL   Creatinine, Ser 1.00 0.61 - 1.24 mg/dL   Calcium 9.0 8.9 - 10.3 mg/dL   GFR calc non Af Amer >60 >60 mL/min   GFR calc Af Amer >60 >60 mL/min   Anion gap 8 5 - 15    Comment: Performed at [facility] [address], [place], [place] [address]    Dg Chest 2 View  Result Date: 08/16/2019 CLINICAL DATA:  Shortness of breath and tachycardia EXAM: CHEST - 2 VIEW COMPARISON:  May 30, 2019 FINDINGS: The mediastinal contour and cardiac silhouette are normal. There is a large right pneumothorax with complete collapse of the right lung. The left lung is clear. Mild degenerative joint changes of the spine are noted. IMPRESSION: Large right pneumothorax with complete collapse of the right lung. These results were called by telephone at the time of interpretation on 08/16/2019 at 2:28 pm to provider [facility] , who verbally acknowledged these results. Electronically Signed   By: Abelardo Diesel M.D.   On: 08/16/2019 14:31   Ct Chest Wo Contrast  Result Date: 08/16/2019 CLINICAL DATA:  Recurrent pneumothorax. EXAM: CT CHEST WITHOUT CONTRAST TECHNIQUE: Multidetector CT imaging of the chest was performed following the standard protocol without IV contrast. COMPARISON:  Chest x-rays from same day. CT chest dated May 19, 2019. FINDINGS: Cardiovascular: No significant vascular findings. Normal heart size. No pericardial effusion. No thoracic aortic aneurysm. Coronary, aortic  arch, and branch vessel atherosclerotic vascular disease. Mediastinum/Nodes: No enlarged mediastinal or axillary lymph nodes. Thyroid gland, trachea, and esophagus demonstrate no significant findings. Lungs/Pleura: Right-sided pigtail chest tube in place. Small residual right-sided pneumothorax. Subsegmental atelectasis in the right upper and lower lobes. Scattered patchy ground-glass opacities throughout the right lung. Unchanged small bullae at the right lung apex and in the medial anterior left upper lobe. No pleural effusion. Unchanged tiny calcified granulomas in the right middle lobe. Upper Abdomen: No acute abnormality. Musculoskeletal: No acute or significant osseous findings. IMPRESSION: 1. Right-sided pigtail chest tube in place with small residual pneumothorax. 2. Mild re-expansion pulmonary edema and subsegmental atelectasis throughout the right lung. 3.  Aortic atherosclerosis (ICD10-I70.0). Electronically Signed   By: Titus Dubin M.D.   On: 08/16/2019 18:11   Dg Chest Port 1 View  Result Date: 08/17/2019 CLINICAL DATA:  Right-sided chest tube, pneumothorax EXAM: PORTABLE CHEST 1 VIEW COMPARISON:  08/16/2019 FINDINGS: Right-sided pigtail chest tube remains in position about the lateral right hemithorax. No significant residual pneumothorax. There is increased conspicuity of irregular heterogeneous opacities of the right midlung. The left lung is normally aerated. IMPRESSION: 1. Right-sided pigtail chest tube remains in position about the lateral right hemithorax. No significant residual pneumothorax. 2. There is increased conspicuity of irregular heterogeneous opacities of the right midlung, which may reflect atelectasis or developing airspace disease. Electronically Signed   By: Eddie Candle M.D.   On: 08/17/2019 10:10   Dg Chest Port 1 View  Result Date: 08/16/2019 CLINICAL DATA:  Chest tube EXAM: PORTABLE CHEST 1 VIEW COMPARISON:  Radiograph 08/16/2019 FINDINGS: Interval placement of a  pigtail pleural catheter in the right mid lung. Near complete re-expansion of the right lung with small amount of residual air and linear opacities likely reflecting bandlike areas of scarring or atelectasis. Left lung remains clear.The cardiomediastinal contours are unremarkable. Degenerative changes are present in the imaged spine and shoulders. IMPRESSION: Interval placement of a pigtail pleural catheter in the right mid lung. Near complete reexpansion of the right lung with small residual pneumothorax and linear atelectasis. Electronically Signed   By: Lovena Le M.D.   On: 08/16/2019 16:55  I personally reviewed the chest X rays and CT chest and concur with the findings noted above.  Review of Systems  Constitutional: Negative for chills, fever and weight loss.  Respiratory: Positive for shortness of breath. Negative for cough and wheezing.   Cardiovascular: Positive for chest pain (right sided pleuritic). Negative for palpitations and leg swelling.  Gastrointestinal: Negative for nausea and vomiting.  Genitourinary: Negative for dysuria and hematuria.  Musculoskeletal: Positive for back pain.  Neurological: Negative for focal weakness, seizures and loss of consciousness.  All other systems reviewed and are negative.  Blood pressure (!) 126/92, pulse 99, temperature 98.1 F (36.7 C), temperature source Oral, resp. rate (!) 22, height 5\' 9"  (1.753 m), weight 82.5 kg, SpO2 99 %. Physical Exam  Constitutional: He is oriented to person, place, and time. He appears well-developed and well-nourished. No distress.  HENT:  Head: Normocephalic and atraumatic.  Eyes: EOM are normal. No scleral icterus.  Neck: Neck supple. No tracheal deviation present.  Cardiovascular: Normal rate, regular rhythm and normal heart sounds. Exam reveals no gallop and no friction rub.  No murmur heard. Respiratory: Effort normal and breath sounds normal. No respiratory distress. He has no wheezes. He has no rales.   CT with respiratory variation, no air leak  GI: Soft. He exhibits no distension. There is no abdominal tenderness.  Musculoskeletal:        General: No edema.  Lymphadenopathy:    He has no cervical adenopathy.  Neurological: He is alert and oriented to person, place, and time. No cranial nerve deficit. He exhibits normal muscle tone.  Skin: Skin is warm and dry.    Assessment/Plan: Mr. Doty is a 58 yo man with a history of tobacco abuse who presents with a recurrent right spontaneous pneumothorax 2 months after hsi first.  VATS for bleb resection and pleural abrasion indicated due to high risk of recurrent pneumothorax.  I discussed the general nature of the procedure, the need for general anesthesia, the incisions to be used, the use of a chest tube postoperatively, the expected hospital stay, overall recovery and short and long term outcomes with Mr. Gassett. He understands the risks include, but are not limited to death, stroke, MI, DVT/PE, bleeding, possible need for transfusion, infections, prolonged air leak, cardiac arrhythmias, as well as the possibility of other unforeseeable complications.   He accepts the risks and agrees to proceed.  Plan OR in AM  Melrose Nakayama 08/17/2019, 3:04 PM

## 2019-08-17 NOTE — Consult Note (Signed)
Reason for Consult:Recurrent spontaneous pneumothorax Referring Physician: CCM  Jeremy Cunningham is an 58 y.o. male.  HPI: 58 yo man presented with c/o Right sided CP and SOB  Jeremy Cunningham is a 58 yo man with a past history of tobacco abuse, hyperlipidemia, hypertension and back pain. He had a right spontaneous pneumothorax in late July 2020. Treated with chest tube and leak resolved. Went to Pulmonary yesterday with right sided CP and shortness of breath. Snet ot ED. CXR showed a 100% right pneumothorax. A pigtail catheter was placed with resolution of pneumothorax. No air leak at present.  Quit smoking 2 months ago after first pneumothorax. 1/2 ppd x 35 years.  Past Medical History:  Diagnosis Date  . Arthritis    back  . Hyperlipidemia   . Spontaneous pneumothorax     Past Surgical History:  Procedure Laterality Date  . TOTAL HIP ARTHROPLASTY  03/2010    Family History  Problem Relation Age of Onset  . Colon cancer Neg Hx   . Esophageal cancer Neg Hx   . Rectal cancer Neg Hx   . Stomach cancer Neg Hx     Social History:  reports that he quit smoking about 3 months ago. His smoking use included cigarettes. He started smoking about 25 years ago. He has a 12.50 pack-year smoking history. He has never used smokeless tobacco. He reports current alcohol use of about 2.0 standard drinks of alcohol per week. He reports that he does not use drugs.  Allergies: No Known Allergies  Medications:  Prior to Admission:  Medications Prior to Admission  Medication Sig Dispense Refill Last Dose  . amLODipine (NORVASC) 5 MG tablet Take 5 mg by mouth daily.   08/16/2019 at am  . fenofibrate (TRICOR) 145 MG tablet Take 145 mg by mouth daily.   08/16/2019 at am  . omega-3 acid ethyl esters (LOVAZA) 1 g capsule Take 1 capsule by mouth daily.    08/16/2019 at am  . traMADol (ULTRAM) 50 MG tablet Take 50 mg by mouth 2 (two) times daily as needed for pain.   Past Week at Unknown time  . triamcinolone cream  (KENALOG) 0.5 % Apply 1 application topically 2 (two) times daily as needed (for rashes).    unk at [employer]  . Ipratropium-Albuterol (COMBIVENT RESPIMAT) 20-100 MCG/ACT AERS respimat Inhale 1 puff into the lungs every 6 (six) hours as needed for wheezing or shortness of breath. (Patient not taking: Reported on 08/16/2019) 4 g 1 Not Taking at Unknown time    Results for orders placed or performed during the hospital encounter of 08/16/19 (from the past 48 hour(s))  Basic metabolic panel     Status: Abnormal   Collection Time: 08/16/19  3:26 PM  Result Value Ref Range   Sodium 136 135 - 145 mmol/L   Potassium 3.8 3.5 - 5.1 mmol/L   Chloride 101 98 - 111 mmol/L   CO2 22 22 - 32 mmol/L   Glucose, Bld 106 (H) 70 - 99 mg/dL   BUN 8 6 - 20 mg/dL   Creatinine, Ser 1.02 0.61 - 1.24 mg/dL   Calcium 10.2 8.9 - 10.3 mg/dL   GFR calc non Af Amer >60 >60 mL/min   GFR calc Af Amer >60 >60 mL/min   Anion gap 13 5 - 15    Comment: Performed at [facility], [place] [address], [place], [place] [address]  CBC with Differential     Status: Abnormal   Collection Time: 08/16/19  3:26 PM  Result Value Ref Range   WBC 3.4 (L) 4.0 - 10.5 K/uL   RBC 4.74 4.22 - 5.81 MIL/uL   Hemoglobin 14.8 13.0 - 17.0 g/dL   HCT 43.8 39.0 - 52.0 %   MCV 92.4 80.0 - 100.0 fL   MCH 31.2 26.0 - 34.0 pg   MCHC 33.8 30.0 - 36.0 g/dL   RDW 12.7 11.5 - 15.5 %   Platelets 237 150 - 400 K/uL   nRBC 0.6 (H) 0.0 - 0.2 %   Neutrophils Relative % 55 %   Neutro Abs 1.9 1.7 - 7.7 K/uL   Lymphocytes Relative 29 %   Lymphs Abs 1.0 0.7 - 4.0 K/uL   Monocytes Relative 13 %   Monocytes Absolute 0.4 0.1 - 1.0 K/uL   Eosinophils Relative 2 %   Eosinophils Absolute 0.1 0.0 - 0.5 K/uL   Basophils Relative 1 %   Basophils Absolute 0.0 0.0 - 0.1 K/uL   Immature Granulocytes 0 %   Abs Immature Granulocytes 0.01 0.00 - 0.07 K/uL    Comment: Performed at [facility], [address] [place], [place] [address]  Protime-INR     Status:  None   Collection Time: 08/16/19  3:26 PM  Result Value Ref Range   Prothrombin Time 13.1 11.4 - 15.2 seconds   INR 1.0 0.8 - 1.2    Comment: (NOTE) INR goal varies based on device and disease states. Performed at [facility], [place] [address] [place], [place] [address]   SARS CORONAVIRUS 2 (TAT 6-24 HRS) Nasopharyngeal Nasopharyngeal Swab     Status: None   Collection Time: 08/16/19  3:40 PM   Specimen: Nasopharyngeal Swab  Result Value Ref Range   SARS Coronavirus 2 NEGATIVE NEGATIVE    Comment: (NOTE) SARS-CoV-2 target nucleic acids are NOT DETECTED. The SARS-CoV-2 RNA is generally detectable in upper and lower respiratory specimens during the acute phase of infection. Negative results do not preclude SARS-CoV-2 infection, do not rule out co-infections with other pathogens, and should not be used as the sole basis for treatment or other patient management decisions. Negative results must be combined with clinical observations, patient history, and epidemiological information. The expected result is Negative. Fact Sheet for Patients: SugarRoll.be Fact Sheet for Healthcare Providers: https://www.woods-mathews.com/ This test is not yet approved or cleared by the [place] FDA and  has been authorized for detection and/or diagnosis of SARS-CoV-2 by FDA under an Emergency Use Authorization (EUA). This EUA will remain  in effect (meaning this test can be used) for the duration of the COVID-19 declaration under Section 56 4(b)(1) of the Act, 21 U.S.C. section 360bbb-3(b)(1), unless the authorization is terminated or revoked sooner. Performed at [facility], [place] [address] [place], [place] [address]   CBC     Status: Abnormal   Collection Time: 08/17/19  3:17 AM  Result Value Ref Range   WBC 3.4 (L) 4.0 - 10.5 K/uL   RBC 3.93 (L) 4.22 - 5.81 MIL/uL   Hemoglobin 12.6 (L) 13.0 - 17.0 g/dL   HCT 38.1 (L) 39.0 - 52.0 %    MCV 96.9 80.0 - 100.0 fL   MCH 32.1 26.0 - 34.0 pg   MCHC 33.1 30.0 - 36.0 g/dL   RDW 13.1 11.5 - 15.5 %   Platelets 208 150 - 400 K/uL   nRBC 0.6 (H) 0.0 - 0.2 %    Comment: Performed at [facility], [place]  [address] [place], [place] [id number]  Basic metabolic panel     Status: None   Collection Time: 08/17/19  3:17 AM  Result Value Ref Range   Sodium 135 135 - 145 mmol/L   Potassium 3.9 3.5 - 5.1 mmol/L   Chloride 103 98 - 111 mmol/L   CO2 24 22 - 32 mmol/L   Glucose, Bld 91 70 - 99 mg/dL   BUN 10 6 - 20 mg/dL   Creatinine, Ser 1.00 0.61 - 1.24 mg/dL   Calcium 9.0 8.9 - 10.3 mg/dL   GFR calc non Af Amer >60 >60 mL/min   GFR calc Af Amer >60 >60 mL/min   Anion gap 8 5 - 15    Comment: Performed at [place] [address] [place], [place] [address]    Dg Chest 2 View  Result Date: 08/16/2019 CLINICAL DATA:  Shortness of breath and tachycardia EXAM: CHEST - 2 VIEW COMPARISON:  May 30, 2019 FINDINGS: The mediastinal contour and cardiac silhouette are normal. There is a large right pneumothorax with complete collapse of the right lung. The left lung is clear. Mild degenerative joint changes of the spine are noted. IMPRESSION: Large right pneumothorax with complete collapse of the right lung. These results were called by telephone at the time of interpretation on 08/16/2019 at 2:28 pm to provider [facility] , who verbally acknowledged these results. Electronically Signed   By: Abelardo Diesel M.D.   On: 08/16/2019 14:31   Ct Chest Wo Contrast  Result Date: 08/16/2019 CLINICAL DATA:  Recurrent pneumothorax. EXAM: CT CHEST WITHOUT CONTRAST TECHNIQUE: Multidetector CT imaging of the chest was performed following the standard protocol without IV contrast. COMPARISON:  Chest x-rays from same day. CT chest dated May 19, 2019. FINDINGS: Cardiovascular: No significant vascular findings. Normal heart size. No pericardial effusion. No thoracic aortic aneurysm. Coronary, aortic  arch, and branch vessel atherosclerotic vascular disease. Mediastinum/Nodes: No enlarged mediastinal or axillary lymph nodes. Thyroid gland, trachea, and esophagus demonstrate no significant findings. Lungs/Pleura: Right-sided pigtail chest tube in place. Small residual right-sided pneumothorax. Subsegmental atelectasis in the right upper and lower lobes. Scattered patchy ground-glass opacities throughout the right lung. Unchanged small bullae at the right lung apex and in the medial anterior left upper lobe. No pleural effusion. Unchanged tiny calcified granulomas in the right middle lobe. Upper Abdomen: No acute abnormality. Musculoskeletal: No acute or significant osseous findings. IMPRESSION: 1. Right-sided pigtail chest tube in place with small residual pneumothorax. 2. Mild re-expansion pulmonary edema and subsegmental atelectasis throughout the right lung. 3.  Aortic atherosclerosis (ICD10-I70.0). Electronically Signed   By: Titus Dubin M.D.   On: 08/16/2019 18:11   Dg Chest Port 1 View  Result Date: 08/17/2019 CLINICAL DATA:  Right-sided chest tube, pneumothorax EXAM: PORTABLE CHEST 1 VIEW COMPARISON:  08/16/2019 FINDINGS: Right-sided pigtail chest tube remains in position about the lateral right hemithorax. No significant residual pneumothorax. There is increased conspicuity of irregular heterogeneous opacities of the right midlung. The left lung is normally aerated. IMPRESSION: 1. Right-sided pigtail chest tube remains in position about the lateral right hemithorax. No significant residual pneumothorax. 2. There is increased conspicuity of irregular heterogeneous opacities of the right midlung, which may reflect atelectasis or developing airspace disease. Electronically Signed   By: Eddie Candle M.D.   On: 08/17/2019 10:10   Dg Chest Port 1 View  Result Date: 08/16/2019 CLINICAL DATA:  Chest tube EXAM: PORTABLE CHEST 1 VIEW COMPARISON:  Radiograph 08/16/2019 FINDINGS: Interval placement of a  pigtail pleural catheter in the right mid lung. Near complete re-expansion of the right lung with small amount of residual air and linear opacities likely reflecting bandlike areas of scarring or atelectasis. Left lung remains clear.The cardiomediastinal contours are unremarkable. Degenerative changes are present in the imaged spine and shoulders. IMPRESSION: Interval placement of a pigtail pleural catheter in the right mid lung. Near complete reexpansion of the right lung with small residual pneumothorax and linear atelectasis. Electronically Signed   By: Lovena Le M.D.   On: 08/16/2019 16:55  I personally reviewed the chest X rays and CT chest and concur with the findings noted above.  Review of Systems  Constitutional: Negative for chills, fever and weight loss.  Respiratory: Positive for shortness of breath. Negative for cough and wheezing.   Cardiovascular: Positive for chest pain (right sided pleuritic). Negative for palpitations and leg swelling.  Gastrointestinal: Negative for nausea and vomiting.  Genitourinary: Negative for dysuria and hematuria.  Musculoskeletal: Positive for back pain.  Neurological: Negative for focal weakness, seizures and loss of consciousness.  All other systems reviewed and are negative.  Blood pressure (!) 126/92, pulse 99, temperature 98.1 F (36.7 C), temperature source Oral, resp. rate (!) 22, height 5\' 9"  (1.753 m), weight 82.5 kg, SpO2 99 %. Physical Exam  Constitutional: He is oriented to person, place, and time. He appears well-developed and well-nourished. No distress.  HENT:  Head: Normocephalic and atraumatic.  Eyes: EOM are normal. No scleral icterus.  Neck: Neck supple. No tracheal deviation present.  Cardiovascular: Normal rate, regular rhythm and normal heart sounds. Exam reveals no gallop and no friction rub.  No murmur heard. Respiratory: Effort normal and breath sounds normal. No respiratory distress. He has no wheezes. He has no rales.   CT with respiratory variation, no air leak  GI: Soft. He exhibits no distension. There is no abdominal tenderness.  Musculoskeletal:        General: No edema.  Lymphadenopathy:    He has no cervical adenopathy.  Neurological: He is alert and oriented to person, place, and time. No cranial nerve deficit. He exhibits normal muscle tone.  Skin: Skin is warm and dry.    Assessment/Plan: Jeremy Cunningham is a 58 yo man with a history of tobacco abuse who presents with a recurrent right spontaneous pneumothorax 2 months after hsi first.  VATS for bleb resection and pleural abrasion indicated due to high risk of recurrent pneumothorax.  I discussed the general nature of the procedure, the need for general anesthesia, the incisions to be used, the use of a chest tube postoperatively, the expected hospital stay, overall recovery and short and long term outcomes with Jeremy Cunningham. He understands the risks include, but are not limited to death, stroke, MI, DVT/PE, bleeding, possible need for transfusion, infections, prolonged air leak, cardiac arrhythmias, as well as the possibility of other unforeseeable complications.   He accepts the risks and agrees to proceed.  Plan OR in AM  Melrose Nakayama 08/17/2019, 3:04 PM

## 2019-08-17 NOTE — Progress Notes (Signed)
Transferred from ED to 4E room 10, appeared alert and oriented x 4, CHG bath given, CCMD called and verified with 2nd person. Room equipment instructions given, call bell within reach.   On 2 LPM of NCL SPO2 98-100%, BP 113/76 mHg, HR 97, normal sinus rhythm on monitor. RR 21. Auscultated left lung: coarse crackle, right lung:coarse crackle and diminished. Non productive cough, severe pain right chest, Morphine 4 mg given as needed.  Chest tube ( Pig tail) with continue suction - 20 cmH2O, no drainage, no subcutaneous emphysema, negative for tachypnea or acute distress on arrival. We will continue to monitor.  Kennyth Lose, RN

## 2019-08-17 NOTE — Progress Notes (Signed)
   NAME:  Jeremy Cunningham, MRN:  [id number], DOB:  05-25-1961, LOS: 1 ADMISSION DATE:  08/16/2019, CONSULTATION DATE:  10/27  REFERRING MD:  Dr. Johnney Killian, CHIEF COMPLAINT:  ptx   Brief History   58 year old male with recent admission for spontaneous pneumothorax, now presenting 10/27 from pulmonary office with recurrent pneumothorax.   Past Medical History   has a past medical history of Arthritis, Hyperlipidemia, and Spontaneous pneumothorax.   Significant Hospital Events   10/27 admit with chest tube placed in ED.   Consults:  PCCM CVTS  Procedures:  Chest tube 10/27 >  Significant Diagnostic Tests:  05/30/2019-pulmonary function test-FVC 3.68 (95% predicted), postbronchodilator ratio 75, postbronchodilator FEV1 2.87 (94% predicted), no bronchodilator response, DLCO 24.35 (90% predicted) CT chest 10/27 > Right-sided pigtail chest tube in place with small residual Pneumothorax. Mild re-expansion pulmonary edema and subsegmental atelectasis throughout the right lung.   Micro Data:    Antimicrobials:    Interim history/subjective:  No distress. Pain managed   Objective   Blood pressure (Abnormal) 126/92, pulse 99, temperature 98.1 F (36.7 C), temperature source Oral, resp. rate (Abnormal) 22, height 5\' 9"  (1.753 m), weight 82.5 kg, SpO2 99 %.        Intake/Output Summary (Last 24 hours) at 08/17/2019 1203 Last data filed at 08/17/2019 0400 Gross per 24 hour  Intake 2364.13 ml  Output 0 ml  Net 2364.13 ml   Filed Weights   08/16/19 1526 08/17/19 0055  Weight: 82.1 kg 82.5 kg    Examination: General pleasant no acute distress HENT NCAT no JVD MMM pulm clear 1/7 airleak on right chest tube Card RRR no MRG abd not tender + bowel sounds Neuro intact   Resolved Hospital Problem list     Assessment & Plan:   R sided pneumothorax: unclear if this is true spontaneous pneumothorax as there were some bullous changes noted on CT from previous admission.  [facility]  personally reviewed. CT in good position. No clear residual PTX. prob Right mid lung atx -airleak: 1 of 7 Plan Keep current CT to sxn Awaiting CVTS consult re: pleurodesis (I verified they will be seeing him later this afternoon)  Possible COPD: smoking history with bullous changes on CT. PFTs reassuring. Likely emphysema. Plan Cont BDs Will arrange pulm f/u   Erick Colace ACNP-BC [place] Pager # [phone] OR # [phone] if no answer  08/17/2019 12:03 PM

## 2019-08-17 NOTE — Progress Notes (Signed)
PROGRESS NOTE  Jeremy Cunningham [id number] DOB: 05-Nov-1960 DOA: 08/16/2019 PCP: Vonna Drafts, FNP  Brief History   58 year old man PMH spontaneous pneumothorax, developed shortness of breath and chest pain 10/26 and was seen by pulmonology 10/27 in which time he was found to have large right spontaneous pneumothorax.  Sent to emergency department.  Admitted for recurrent right-sided spontaneous pneumothorax  A & P  Recurrent right-sided pneumothorax, status post chest tube placement in ED.  Unclear whether spontaneous given some bullous changes noted on previous CT. --Chest tube management per CT surgery, pulmonology --Pleurodesis being considered --Follow-up chest x-ray today showed no significant residual pneumothorax.  Irregular heterogeneous opacities right midlung probably reflect atelectasis  Acute hypoxic respiratory failure --Secondary to pneumothorax --Wean oxygen as tolerated  Pulmonary bulla.  CT previously showed bullous changes but PFTs were reassuring. --Continue Combivent as needed, follow-up with pulmonology as an outpatient  Essential hypertension --Stable.  Continue amlodipine   Resolved Hospital Problem list       DVT prophylaxis: SCDs Code Status: Full Family Communication: none Disposition Plan: home    Murray Hodgkins, MD  [facility] Direct contact: see [email or web address] (further directions at bottom of note if needed) 7PM-7AM contact night coverage as at bottom of note 08/17/2019, 10:21 AM  LOS: 1 day   Significant Hospital Events   . 10/27 admitted for recurrent spontaneous pneumothorax   Consults:  . Pulmonology . Cardiothoracic surgery   Procedures:  . 10/27 chest tube insertion right  Significant Diagnostic Tests:  . SARS-CoV-2 negative . 10/27 chest x-ray large right pneumothorax . 10/27 CT chest small residual pneumothorax with chest tube in place.   Micro Data:  .    Antimicrobials:  .   Interval History/Subjective   Feels okay, breathing okay.  Has some chest pain on the right side and insertion of chest tube.  Objective   Vitals:  Vitals:   08/17/19 0300 08/17/19 0400  BP: 122/89 (!) 110/91  Pulse: 94 93  Resp: 20 (!) 22  Temp:  98.1 F (36.7 C)  SpO2: 97% 98%    Exam:  Constitutional:  . Appears calm, mildly uncomfortable, nontoxic ENMT:  . grossly normal hearing  . Lips appear normal Respiratory:  . CTA bilaterally, no w/r/r, decreased breath sounds on the right compared to the left . Respiratory effort normal.  Cardiovascular:  . RRR, no m/r/g . Telemetry sinus rhythm . No LE extremity edema   Psychiatric:  . Mental status o Mood, affect appropriate . judgment and insight appear intact     I have personally reviewed the following:   Today's Data  . BMP unremarkable . WBC stable at 3.4 . Hemoglobin 12.6  Scheduled Meds: . amLODipine  5 mg Oral Daily  . fenofibrate  160 mg Oral Daily  . sodium chloride flush  3 mL Intravenous Q12H   Continuous Infusions: . sodium chloride 1,000 mL (08/17/19 0124)    Principal Problem:   Recurrent pneumothorax Active Problems:   Hypercholesteremia   HTN (hypertension)   Former smoker   Acute hypoxemic respiratory failure ([place])   LOS: 1 day   How to contact the [facility] Attending or Consulting provider [place] or covering provider during after hours [place], for this patient?  1. Check the care team in [facility] and look for a) attending/consulting TR[facility] provider listed and b) the [facility] team listed 2. Log into [email or web address] and use [place]'s universal password to access. If you do not have the password, please contact  the hospital operator. 3. Locate the [facility] provider you are looking for under [facility] and page to a number that you can be directly reached. 4. If you still have difficulty reaching the provider, please page the [facility] (Director on Call) for the Hospitalists listed on amion for assistance.

## 2019-08-18 ENCOUNTER — Encounter (HOSPITAL_COMMUNITY): Payer: Self-pay | Admitting: Certified Registered Nurse Anesthetist

## 2019-08-18 ENCOUNTER — Inpatient Hospital Stay (HOSPITAL_COMMUNITY): Payer: Medicare Other

## 2019-08-18 ENCOUNTER — Encounter (HOSPITAL_COMMUNITY)
Admission: EM | Disposition: A | Discharge status: Home / Self Care | Payer: Self-pay | Source: Ambulatory Visit | Attending: Thoracic Surgery (Cardiothoracic Vascular Surgery)

## 2019-08-18 DIAGNOSIS — J9311 Primary spontaneous pneumothorax: Secondary | ICD-10-CM

## 2019-08-18 DIAGNOSIS — J439 Emphysema, unspecified: Secondary | ICD-10-CM | POA: Diagnosis present

## 2019-08-18 HISTORY — PX: VIDEO ASSISTED THORACOSCOPY: SHX5073

## 2019-08-18 HISTORY — PX: INTERCOSTAL NERVE BLOCK: SHX5021

## 2019-08-18 HISTORY — PX: PLEURADESIS: SHX6030

## 2019-08-18 HISTORY — PX: STAPLING OF BLEBS: SHX6429

## 2019-08-18 LAB — URINALYSIS, ROUTINE W REFLEX MICROSCOPIC
Bilirubin Urine: NEGATIVE
Glucose, UA: NEGATIVE mg/dL
Hgb urine dipstick: NEGATIVE
Ketones, ur: NEGATIVE mg/dL
Leukocytes,Ua: NEGATIVE
Nitrite: NEGATIVE
Protein, ur: NEGATIVE mg/dL
Specific Gravity, Urine: 1.009 (ref 1.005–1.030)
pH: 6 (ref 5.0–8.0)

## 2019-08-18 LAB — SURGICAL PCR SCREEN
MRSA, PCR: NEGATIVE
Staphylococcus aureus: NEGATIVE

## 2019-08-18 LAB — GLUCOSE, CAPILLARY: Glucose-Capillary: 98 mg/dL (ref 70–99)

## 2019-08-18 SURGERY — VIDEO ASSISTED THORACOSCOPY
Anesthesia: General | Site: Chest | Laterality: Right

## 2019-08-18 MED ORDER — AMLODIPINE BESYLATE 5 MG PO TABS
5.0000 mg | ORAL_TABLET | Freq: Every day | ORAL | Status: DC
  Administered 2019-08-19 – 2019-08-21 (×3): 5 mg via ORAL
  Filled 2019-08-18 (×3): qty 1

## 2019-08-18 MED ORDER — SODIUM CHLORIDE 0.9 % IV SOLN
INTRAVENOUS | Status: DC
  Administered 2019-08-18: 13:00:00 via INTRAVENOUS

## 2019-08-18 MED ORDER — BUPIVACAINE HCL (PF) 0.5 % IJ SOLN
INTRAMUSCULAR | Status: DC | PRN
  Administered 2019-08-18: 30 mL

## 2019-08-18 MED ORDER — PHENYLEPHRINE 40 MCG/ML (10ML) SYRINGE FOR IV PUSH (FOR BLOOD PRESSURE SUPPORT)
PREFILLED_SYRINGE | INTRAVENOUS | Status: DC | PRN
  Administered 2019-08-18: 80 ug via INTRAVENOUS
  Administered 2019-08-18: 40 ug via INTRAVENOUS

## 2019-08-18 MED ORDER — PROPOFOL 10 MG/ML IV BOLUS
INTRAVENOUS | Status: DC | PRN
  Administered 2019-08-18: 120 mg via INTRAVENOUS

## 2019-08-18 MED ORDER — KETOROLAC TROMETHAMINE 30 MG/ML IJ SOLN
INTRAMUSCULAR | Status: DC | PRN
  Administered 2019-08-18: 30 mg via INTRAVENOUS

## 2019-08-18 MED ORDER — LIDOCAINE 2% (20 MG/ML) 5 ML SYRINGE
INTRAMUSCULAR | Status: AC
  Filled 2019-08-18: qty 5

## 2019-08-18 MED ORDER — MORPHINE SULFATE 2 MG/ML IV SOLN
INTRAVENOUS | Status: DC
  Administered 2019-08-18: 11:00:00 via INTRAVENOUS
  Administered 2019-08-18: 1.5 mg via INTRAVENOUS
  Administered 2019-08-18: 3 mg via INTRAVENOUS
  Administered 2019-08-19: 7.5 mg via INTRAVENOUS
  Administered 2019-08-19: 1.5 mg via INTRAVENOUS
  Administered 2019-08-19: 1 mg via INTRAVENOUS
  Administered 2019-08-19: 9 mg via INTRAVENOUS
  Administered 2019-08-19: 12 mg via INTRAVENOUS
  Administered 2019-08-19: 4 mg via INTRAVENOUS
  Administered 2019-08-19: 23:00:00 via INTRAVENOUS
  Administered 2019-08-19: 7.5 mg via INTRAVENOUS
  Administered 2019-08-20: 2 mg via INTRAVENOUS
  Administered 2019-08-20: 1.5 mg via INTRAVENOUS
  Filled 2019-08-18: qty 30

## 2019-08-18 MED ORDER — FENTANYL CITRATE (PF) 250 MCG/5ML IJ SOLN
INTRAMUSCULAR | Status: AC
  Filled 2019-08-18: qty 5

## 2019-08-18 MED ORDER — MIDAZOLAM HCL 5 MG/5ML IJ SOLN
INTRAMUSCULAR | Status: DC | PRN
  Administered 2019-08-18 (×2): 1 mg via INTRAVENOUS

## 2019-08-18 MED ORDER — LACTATED RINGERS IV SOLN
INTRAVENOUS | Status: DC | PRN
  Administered 2019-08-18: 09:00:00 via INTRAVENOUS

## 2019-08-18 MED ORDER — ACETAMINOPHEN 500 MG PO TABS
1000.0000 mg | ORAL_TABLET | Freq: Four times a day (QID) | ORAL | Status: DC
  Administered 2019-08-18 – 2019-08-21 (×10): 1000 mg via ORAL
  Filled 2019-08-18 (×11): qty 2

## 2019-08-18 MED ORDER — CEFAZOLIN SODIUM-DEXTROSE 2-4 GM/100ML-% IV SOLN
2.0000 g | Freq: Three times a day (TID) | INTRAVENOUS | Status: AC
  Administered 2019-08-18 – 2019-08-19 (×2): 2 g via INTRAVENOUS
  Filled 2019-08-18 (×3): qty 100

## 2019-08-18 MED ORDER — PROMETHAZINE HCL 25 MG/ML IJ SOLN: 6.2500 mg | INTRAMUSCULAR | Status: DC | PRN

## 2019-08-18 MED ORDER — SODIUM CHLORIDE (PF) 0.9 % IJ SOLN
INTRAMUSCULAR | Status: DC | PRN
  Administered 2019-08-18: 50 mL via INTRAVENOUS

## 2019-08-18 MED ORDER — ONDANSETRON HCL 4 MG/2ML IJ SOLN
INTRAMUSCULAR | Status: AC
  Filled 2019-08-18: qty 2

## 2019-08-18 MED ORDER — LACTATED RINGERS IV SOLN
INTRAVENOUS | Status: DC | PRN
  Administered 2019-08-18: 08:00:00 via INTRAVENOUS

## 2019-08-18 MED ORDER — DIPHENHYDRAMINE HCL 12.5 MG/5ML PO ELIX
12.5000 mg | ORAL_SOLUTION | Freq: Four times a day (QID) | ORAL | Status: DC | PRN
  Filled 2019-08-18: qty 5

## 2019-08-18 MED ORDER — OXYCODONE HCL 5 MG PO TABS
5.0000 mg | ORAL_TABLET | ORAL | Status: DC | PRN
  Administered 2019-08-20 – 2019-08-21 (×4): 10 mg via ORAL
  Filled 2019-08-18 (×4): qty 2

## 2019-08-18 MED ORDER — LIDOCAINE 2% (20 MG/ML) 5 ML SYRINGE
INTRAMUSCULAR | Status: DC | PRN
  Administered 2019-08-18: 50 mg via INTRAVENOUS
  Administered 2019-08-18: 40 mg via INTRAVENOUS

## 2019-08-18 MED ORDER — DIPHENHYDRAMINE HCL 50 MG/ML IJ SOLN: 12.5000 mg | Freq: Four times a day (QID) | INTRAMUSCULAR | Status: DC | PRN

## 2019-08-18 MED ORDER — SODIUM CHLORIDE 0.9 % IV SOLN: INTRAVENOUS | Status: DC | PRN

## 2019-08-18 MED ORDER — 0.9 % SODIUM CHLORIDE (POUR BTL) OPTIME
TOPICAL | Status: DC | PRN
  Administered 2019-08-18: 2000 mL

## 2019-08-18 MED ORDER — MORPHINE SULFATE 2 MG/ML IV SOLN
INTRAVENOUS | Status: AC
  Filled 2019-08-18: qty 30

## 2019-08-18 MED ORDER — BISACODYL 5 MG PO TBEC
10.0000 mg | DELAYED_RELEASE_TABLET | Freq: Every day | ORAL | Status: DC
  Administered 2019-08-19 – 2019-08-20 (×2): 10 mg via ORAL
  Filled 2019-08-18 (×2): qty 2

## 2019-08-18 MED ORDER — FENTANYL CITRATE (PF) 100 MCG/2ML IJ SOLN: 25.0000 ug | INTRAMUSCULAR | Status: DC | PRN

## 2019-08-18 MED ORDER — ENOXAPARIN SODIUM 40 MG/0.4ML ~~LOC~~ SOLN
40.0000 mg | Freq: Every day | SUBCUTANEOUS | Status: DC
  Administered 2019-08-19 – 2019-08-21 (×3): 40 mg via SUBCUTANEOUS
  Filled 2019-08-18 (×3): qty 0.4

## 2019-08-18 MED ORDER — NALOXONE HCL 0.4 MG/ML IJ SOLN: 0.4000 mg | INTRAMUSCULAR | Status: DC | PRN

## 2019-08-18 MED ORDER — SUGAMMADEX SODIUM 200 MG/2ML IV SOLN
INTRAVENOUS | Status: DC | PRN
  Administered 2019-08-18: 200 mg via INTRAVENOUS

## 2019-08-18 MED ORDER — ROCURONIUM BROMIDE 10 MG/ML (PF) SYRINGE
PREFILLED_SYRINGE | INTRAVENOUS | Status: AC
  Filled 2019-08-18: qty 10

## 2019-08-18 MED ORDER — BUPIVACAINE LIPOSOME 1.3 % IJ SUSP
INTRAMUSCULAR | Status: DC | PRN
  Administered 2019-08-18: 20 mL

## 2019-08-18 MED ORDER — MIDAZOLAM HCL 2 MG/2ML IJ SOLN
INTRAMUSCULAR | Status: AC
  Filled 2019-08-18: qty 2

## 2019-08-18 MED ORDER — DEXAMETHASONE SODIUM PHOSPHATE 10 MG/ML IJ SOLN
INTRAMUSCULAR | Status: AC
  Filled 2019-08-18: qty 1

## 2019-08-18 MED ORDER — INSULIN ASPART 100 UNIT/ML ~~LOC~~ SOLN: 0.0000 [IU] | SUBCUTANEOUS | Status: DC

## 2019-08-18 MED ORDER — SENNOSIDES-DOCUSATE SODIUM 8.6-50 MG PO TABS
1.0000 | ORAL_TABLET | Freq: Every day | ORAL | Status: DC
  Administered 2019-08-18 – 2019-08-20 (×3): 1 via ORAL
  Filled 2019-08-18 (×3): qty 1

## 2019-08-18 MED ORDER — KETOROLAC TROMETHAMINE 15 MG/ML IJ SOLN: 15.0000 mg | Freq: Four times a day (QID) | INTRAMUSCULAR | Status: AC | PRN

## 2019-08-18 MED ORDER — FENTANYL CITRATE (PF) 100 MCG/2ML IJ SOLN
INTRAMUSCULAR | Status: DC | PRN
  Administered 2019-08-18 (×3): 50 ug via INTRAVENOUS

## 2019-08-18 MED ORDER — ACETAMINOPHEN 160 MG/5ML PO SOLN: 1000.0000 mg | Freq: Four times a day (QID) | ORAL | Status: DC

## 2019-08-18 MED ORDER — SODIUM CHLORIDE 0.9% FLUSH: 9.0000 mL | INTRAVENOUS | Status: DC | PRN

## 2019-08-18 MED ORDER — DEXAMETHASONE SODIUM PHOSPHATE 10 MG/ML IJ SOLN
INTRAMUSCULAR | Status: DC | PRN
  Administered 2019-08-18: 10 mg via INTRAVENOUS

## 2019-08-18 MED ORDER — BUPIVACAINE HCL (PF) 0.5 % IJ SOLN
INTRAMUSCULAR | Status: AC
  Filled 2019-08-18: qty 30

## 2019-08-18 MED ORDER — TRAMADOL HCL 50 MG PO TABS
50.0000 mg | ORAL_TABLET | Freq: Four times a day (QID) | ORAL | Status: DC | PRN
  Administered 2019-08-20 – 2019-08-21 (×4): 100 mg via ORAL
  Filled 2019-08-18 (×4): qty 2

## 2019-08-18 MED ORDER — ONDANSETRON HCL 4 MG/2ML IJ SOLN: 4.0000 mg | Freq: Four times a day (QID) | INTRAMUSCULAR | Status: DC | PRN

## 2019-08-18 MED ORDER — PROPOFOL 10 MG/ML IV BOLUS
INTRAVENOUS | Status: AC
  Filled 2019-08-18: qty 20

## 2019-08-18 MED ORDER — GLYCOPYRROLATE 0.2 MG/ML IJ SOLN
INTRAMUSCULAR | Status: DC | PRN
  Administered 2019-08-18 (×2): 0.1 mg via INTRAVENOUS

## 2019-08-18 MED ORDER — CHLORHEXIDINE GLUCONATE CLOTH 2 % EX PADS
6.0000 | MEDICATED_PAD | Freq: Every day | CUTANEOUS | Status: DC
  Administered 2019-08-18 – 2019-08-21 (×4): 6 via TOPICAL

## 2019-08-18 MED ORDER — ROCURONIUM BROMIDE 50 MG/5ML IV SOSY
PREFILLED_SYRINGE | INTRAVENOUS | Status: DC | PRN
  Administered 2019-08-18: 20 mg via INTRAVENOUS
  Administered 2019-08-18: 60 mg via INTRAVENOUS

## 2019-08-18 SURGICAL SUPPLY — 87 items
APPLICATOR COTTON TIP 6 STRL (MISCELLANEOUS) IMPLANT
APPLICATOR COTTON TIP 6IN STRL (MISCELLANEOUS) IMPLANT
APPLIER CLIP ROT 10 11.4 M/L (STAPLE)
BLADE CLIPPER SURG (BLADE) ×5 IMPLANT
CANISTER SUCT 3000ML PPV (MISCELLANEOUS) ×5 IMPLANT
CATH THORACIC 28FR (CATHETERS) ×2 IMPLANT
CATH THORACIC 28FR RT ANG (CATHETERS) IMPLANT
CATH THORACIC 36FR (CATHETERS) IMPLANT
CATH THORACIC 36FR RT ANG (CATHETERS) IMPLANT
CLEANER TIP ELECTROSURG 2X2 (MISCELLANEOUS) ×2 IMPLANT
CLIP APPLIE ROT 10 11.4 M/L (STAPLE) IMPLANT
CLIP VESOCCLUDE MED 6/CT (CLIP) ×2 IMPLANT
CONN Y 3/8X3/8X3/8  BEN (MISCELLANEOUS) ×2
CONN Y 3/8X3/8X3/8 BEN (MISCELLANEOUS) ×3 IMPLANT
CONT SPEC 4OZ CLIKSEAL STRL BL (MISCELLANEOUS) ×16 IMPLANT
COVER SURGICAL LIGHT HANDLE (MISCELLANEOUS) ×5 IMPLANT
COVER WAND RF STERILE (DRAPES) ×5 IMPLANT
CUTTER ECHEON FLEX ENDO 45 340 (ENDOMECHANICALS) ×2 IMPLANT
DERMABOND ADVANCED (GAUZE/BANDAGES/DRESSINGS) ×2
DERMABOND ADVANCED .7 DNX12 (GAUZE/BANDAGES/DRESSINGS) IMPLANT
DRAIN CHANNEL 28F RND 3/8 FF (WOUND CARE) IMPLANT
DRAIN CHANNEL 32F RND 10.7 FF (WOUND CARE) IMPLANT
DRAPE CV SPLIT W-CLR ANES SCRN (DRAPES) ×5 IMPLANT
DRAPE ORTHO SPLIT 77X108 STRL (DRAPES) ×2
DRAPE SLUSH/WARMER DISC (DRAPES) ×5 IMPLANT
DRAPE SURG ORHT 6 SPLT 77X108 (DRAPES) ×3 IMPLANT
ELECT BLADE 6.5 EXT (BLADE) ×5 IMPLANT
ELECT REM PT RETURN 9FT ADLT (ELECTROSURGICAL) ×5
ELECTRODE REM PT RTRN 9FT ADLT (ELECTROSURGICAL) ×3 IMPLANT
GAUZE SPONGE 4X4 12PLY STRL (GAUZE/BANDAGES/DRESSINGS) ×7 IMPLANT
GLOVE BIOGEL PI IND STRL 6.5 (GLOVE) IMPLANT
GLOVE BIOGEL PI INDICATOR 6.5 (GLOVE) ×2
GLOVE SURG SIGNA 7.5 PF LTX (GLOVE) ×10 IMPLANT
GLOVE TRIUMPH SURG SIZE 7.5 (KITS) ×5 IMPLANT
GOWN STRL REUS W/ TWL LRG LVL3 (GOWN DISPOSABLE) ×6 IMPLANT
GOWN STRL REUS W/ TWL XL LVL3 (GOWN DISPOSABLE) ×3 IMPLANT
GOWN STRL REUS W/TWL LRG LVL3 (GOWN DISPOSABLE) ×4
GOWN STRL REUS W/TWL XL LVL3 (GOWN DISPOSABLE) ×2
HEMOSTAT SURGICEL 2X14 (HEMOSTASIS) IMPLANT
IV CATH 22GX1 FEP (IV SOLUTION) IMPLANT
KIT BASIN OR (CUSTOM PROCEDURE TRAY) ×5 IMPLANT
KIT SUCTION CATH 14FR (SUCTIONS) ×5 IMPLANT
KIT TURNOVER KIT B (KITS) ×5 IMPLANT
NDL HYPO 25GX1X1/2 BEV (NEEDLE) ×3 IMPLANT
NEEDLE HYPO 25GX1X1/2 BEV (NEEDLE) ×5 IMPLANT
NS IRRIG 1000ML POUR BTL (IV SOLUTION) ×10 IMPLANT
PACK CHEST (CUSTOM PROCEDURE TRAY) ×5 IMPLANT
PAD ARMBOARD 7.5X6 YLW CONV (MISCELLANEOUS) ×10 IMPLANT
POUCH ENDO CATCH II 15MM (MISCELLANEOUS) IMPLANT
POUCH SPECIMEN RETRIEVAL 10MM (ENDOMECHANICALS) IMPLANT
RELOAD STAPLE 45 GOLD REG/THCK (STAPLE) IMPLANT
SEALANT PROGEL (MISCELLANEOUS) IMPLANT
SEALANT SURG COSEAL 4ML (VASCULAR PRODUCTS) IMPLANT
SEALANT SURG COSEAL 8ML (VASCULAR PRODUCTS) IMPLANT
SOL ANTI FOG 6CC (MISCELLANEOUS) ×3 IMPLANT
SOLUTION ANTI FOG 6CC (MISCELLANEOUS) ×2
SPECIMEN JAR MEDIUM (MISCELLANEOUS) ×5 IMPLANT
SPONGE INTESTINAL PEANUT (DISPOSABLE) IMPLANT
SPONGE TONSIL TAPE 1 RFD (DISPOSABLE) ×5 IMPLANT
STAPLE RELOAD 45MM GOLD (STAPLE) ×55 IMPLANT
STOPCOCK 4 WAY LG BORE MALE ST (IV SETS) ×5 IMPLANT
SUT PROLENE 4 0 RB 1 (SUTURE)
SUT PROLENE 4-0 RB1 .5 CRCL 36 (SUTURE) IMPLANT
SUT SILK  1 MH (SUTURE) ×6
SUT SILK 1 MH (SUTURE) ×6 IMPLANT
SUT SILK 2 0SH CR/8 30 (SUTURE) IMPLANT
SUT SILK 3 0SH CR/8 30 (SUTURE) IMPLANT
SUT VIC AB 1 CTX 36 (SUTURE) ×2
SUT VIC AB 1 CTX36XBRD ANBCTR (SUTURE) IMPLANT
SUT VIC AB 2-0 CTX 36 (SUTURE) ×2 IMPLANT
SUT VIC AB 2-0 UR6 27 (SUTURE) IMPLANT
SUT VIC AB 3-0 MH 27 (SUTURE) IMPLANT
SUT VIC AB 3-0 X1 27 (SUTURE) ×7 IMPLANT
SUT VICRYL 2 TP 1 (SUTURE) IMPLANT
SYR 10ML LL (SYRINGE) ×5 IMPLANT
SYR 20ML LL LF (SYRINGE) IMPLANT
SYR 50ML LL SCALE MARK (SYRINGE) ×5 IMPLANT
SYSTEM SAHARA CHEST DRAIN ATS (WOUND CARE) ×5 IMPLANT
TAPE CLOTH 4X10 WHT NS (GAUZE/BANDAGES/DRESSINGS) ×5 IMPLANT
TIP APPLICATOR SPRAY EXTEND 16 (VASCULAR PRODUCTS) IMPLANT
TOWEL GREEN STERILE (TOWEL DISPOSABLE) ×5 IMPLANT
TOWEL GREEN STERILE FF (TOWEL DISPOSABLE) ×5 IMPLANT
TRAY FOLEY MTR SLVR 16FR STAT (SET/KITS/TRAYS/PACK) ×5 IMPLANT
TROCAR XCEL BLADELESS 5X75MML (TROCAR) ×5 IMPLANT
TROCAR XCEL NON-BLD 5MMX100MML (ENDOMECHANICALS) IMPLANT
TUBING EXTENTION W/L.L. (IV SETS) ×5 IMPLANT
WATER STERILE IRR 1000ML POUR (IV SOLUTION) ×10 IMPLANT

## 2019-08-18 NOTE — Interval H&P Note (Signed)
History and Physical Interval Note:  08/18/2019 8:07 AM  Jeremy Cunningham  has presented today for surgery, with the diagnosis of [place].  The various methods of treatment have been discussed with the patient and family. After consideration of risks, benefits and other options for treatment, the patient has consented to  Procedure(s): VIDEO ASSISTED THORACOSCOPY (Right) STAPLING OF BLEBS (Right) as a surgical intervention.  The patient's history has been reviewed, patient examined, no change in status, stable for surgery.  I have reviewed the patient's chart and labs.  Questions were answered to the patient's satisfaction.     Melrose Nakayama

## 2019-08-18 NOTE — Discharge Summary (Addendum)
Physician Discharge Summary        [address]       [place] [address]             [phone]      Patient ID: Jeremy Cunningham MRN: [id number] DOB/AGE: [birth year] 58 y.o.  Admit date: 08/16/2019 Discharge date: 08/22/2019  Admission Diagnoses: Recurrent spontaneous pneumothorax  Patient Active Problem List   Diagnosis Date Noted   Bleb, lung ([place]) 08/18/2019   Acute hypoxemic respiratory failure ([place]) 08/17/2019   Recurrent pneumothorax 08/16/2019   Former smoker 05/30/2019   Pneumothorax 05/12/2019   Hypercholesteremia 05/12/2019   HTN (hypertension) 05/12/2019   Atypical chest pain 05/12/2019    Discharge Diagnoses:  Principal Problem:   Recurrent pneumothorax Active Problems:   Hypercholesteremia   HTN (hypertension)   Former smoker   Acute hypoxemic respiratory failure ([place])   Bleb, lung ([place])   Discharged Condition: good  HPI:   Mr. Seaberry is a 58 yo man with a past history of tobacco abuse, hyperlipidemia, hypertension and back pain. He had a right spontaneous pneumothorax in late July 2020. Treated with chest tube and leak resolved. Went to Pulmonary yesterday with right sided CP and shortness of breath. Snet ot ED. CXR showed a 100% right pneumothorax. A pigtail catheter was placed with resolution of pneumothorax. No air leak at present.  Quit smoking 2 months ago after first pneumothorax. 1/2 ppd x 35 years.   Hospital Course:   Mr. Miyata underwent a bleb resection, mechanical pleurodesis and intercostal nerve block with Dr. Roxan Hockey on 08/18/2019. He tolerated the procedure well and was transferred to the stepdown unit for continued care. He was extubated in a timely manner. He continued to progress. There was no air leak. Chest tube was switched to water seal. CXRs remained stable. POD 2 we removed his chest tube. We discontinued his PCA. He did have some hypertension but his home dose of Norvasc was starter. He continued to  ambulate around the unit. On POD 3 he was tolerating room air with good oxygen saturation, his incision is healing well, he was ambulating with limited assistance, and he was ready for discharge home.   Consults: None  Significant Diagnostic Studies:   CLINICAL DATA:  Status post VATS procedure or recurrent right pneumothorax.  EXAM: PORTABLE CHEST 1 VIEW  COMPARISON:  08/17/2019  FINDINGS: New surgical staples are noted in the right lung apex. Right chest tube has been exchanged. No visible right pneumothorax.  Heart size and vascularity are normal. Minimal atelectasis at the left lung base. No acute bone abnormality.  IMPRESSION: 1. No pneumothorax. 2. Minimal atelectasis at the left lung base. 3. New surgical staples in the right lung apex.   Electronically Signed   By: Lorriane Shire M.D.   On: 08/18/2019 11:12   Treatments:   NAME: RONIN, FARB MEDICAL RECORD [id number] ACCOUNT [id number] DATE OF BIRTH:08/06/1961 FACILITY: [facility] LOCATION: [facility] PHYSICIAN:Lesleyann Fichter Chaya Jan, MD  OPERATIVE REPORT  DATE OF PROCEDURE:  08/18/2019  PREOPERATIVE DIAGNOSIS:  Recurrent right spontaneous pneumothorax.  POSTOPERATIVE DIAGNOSIS:  Recurrent right spontaneous pneumothorax.  PROCEDURE:  Right video-assisted thoracoscopy, bleb resection, pleural abrasion and intercostal nerve blocks at levels 3 through 10.  SURGEON:  Modesto Charon, MD  ASSISTANT:  Nicholes Rough, PA  ANESTHESIA:  General.  FINDINGS:  Ruptured bleb at the right apex.  Additional smaller bleb along the anterior aspect of right upper lobe, tiny blebs along the inferior margin of the upper lobe and lateral  margin of the middle lobe.   Discharge Exam: Blood pressure (!) 140/96, pulse 99, temperature 98.8 F (37.1 C), temperature source Oral, resp. rate 19, height 5\' 9"  (1.753 m), weight 86.6 kg, SpO2 95 %.   General appearance: alert, cooperative and no distress Heart:  regular rate and rhythm Lungs: min dim in left base Abdomen: benign Extremities: no calf tenderness or edema Wound: incis healing well  Disposition: Discharge disposition: 01-Home or Self Care       Discharge Instructions    Discharge patient   Complete by: As directed    Discharge disposition: 01-Home or Self Care   Discharge patient date: 08/21/2019     Allergies as of 08/21/2019   No Known Allergies     Medication List    TAKE these medications   amLODipine 5 MG tablet Commonly known as: NORVASC Take 5 mg by mouth daily.   Combivent Respimat 20-100 MCG/ACT Aers respimat Generic drug: Ipratropium-Albuterol Inhale 1 puff into the lungs every 6 (six) hours as needed for wheezing or shortness of breath.   fenofibrate 145 MG tablet Commonly known as: TRICOR Take 145 mg by mouth daily.   omega-3 acid ethyl esters 1 g capsule Commonly known as: LOVAZA Take 1 capsule by mouth daily.   traMADol 50 MG tablet Commonly known as: ULTRAM Take 1 tablet (50 mg total) by mouth every 6 (six) hours as needed for up to 7 days (mild pain). What changed:   when to take this  reasons to take this   triamcinolone cream 0.5 % Commonly known as: KENALOG Apply 1 application topically 2 (two) times daily as needed (for rashes).      Follow-up Information    Vonna Drafts, FNP. Call in 1 day(s).   Specialty: Nurse Practitioner Contact information: [place] [address] [phone]        Melrose Nakayama, MD Follow up.   Specialty: Cardiothoracic Surgery Contact information: [place] [address] [phone]           Signed: Elgie Collard 08/22/2019, 11:12 AM

## 2019-08-18 NOTE — Discharge Instructions (Signed)

## 2019-08-18 NOTE — Progress Notes (Signed)
PROGRESS NOTE  Jeremy Cunningham [id number] DOB: 02-21-1961 DOA: 08/16/2019 PCP: Vonna Drafts, FNP  Brief History   58 year old man PMH spontaneous pneumothorax, developed shortness of breath and chest pain 10/26 and was seen by pulmonology 10/27 in which time he was found to have large right spontaneous pneumothorax.  Sent to emergency department.  Admitted for recurrent right-sided spontaneous pneumothorax  A & P  Recurrent right-sided pneumothorax, status post chest tube placement in ED.  Unclear whether spontaneous given some bullous changes noted on previous CT. --Resolved status post video-assisted thorascopic procedure and stapling of blebs, pleurodesis  Acute hypoxic respiratory failure --Secondary to pneumothorax --Wean as tolerated  Pulmonary bulla.  CT previously showed bullous changes but PFTs were reassuring. --Continue Combivent as needed, follow-up with pulmonology as an outpatient  Essential hypertension --Remains stable.  Continue amlodipine  DVT prophylaxis: SCDs Code Status: Full Family Communication: none Disposition Plan: home    Murray Hodgkins, MD  [facility] Direct contact: see [email or web address] (further directions at bottom of note if needed) 7PM-7AM contact night coverage as at bottom of note 08/18/2019, 3:17 PM  LOS: 2 days   Significant Hospital Events   . 10/27 admitted for recurrent spontaneous pneumothorax   Consults:  . Pulmonology . Cardiothoracic surgery   Procedures:  . 10/27 chest tube insertion right  Significant Diagnostic Tests:  . SARS-CoV-2 negative . 10/27 chest x-ray large right pneumothorax . 10/27 CT chest small residual pneumothorax with chest tube in place.   Micro Data:  .    Antimicrobials:  .   Interval History/Subjective  Breathing better after surgery.  Objective   Vitals:  Vitals:   08/18/19 1320 08/18/19 1354  BP: 99/68 136/86  Pulse: (!) 103   Resp: 19 19  Temp:    SpO2: 98% 98%    Exam:   Constitutional:   . Appears calm and comfortable Respiratory:  . CTA bilaterally, no w/r/r.  . Respiratory effort normal.  Cardiovascular:  . RRR, no m/r/g Psychiatric:  . Mental status o Mood, affect appropriate  I have personally reviewed the following:   Today's Data  . Chest x-ray no pneumothorax  Scheduled Meds: . acetaminophen  1,000 mg Oral Q6H   Or  . acetaminophen (TYLENOL) oral liquid 160 mg/5 mL  1,000 mg Oral Q6H  . [START ON 08/19/2019] amLODipine  5 mg Oral Daily  . [START ON 08/19/2019] bisacodyl  10 mg Oral Daily  . Chlorhexidine Gluconate Cloth  6 each Topical Daily  . [START ON 08/19/2019] enoxaparin (LOVENOX) injection  40 mg Subcutaneous Daily  . morphine   Intravenous Q4H  . morphine      . senna-docusate  1 tablet Oral QHS   Continuous Infusions: . sodium chloride    . sodium chloride 100 mL/hr at 08/18/19 1311  .  ceFAZolin (ANCEF) IV      Principal Problem:   Recurrent pneumothorax Active Problems:   Hypercholesteremia   HTN (hypertension)   Former smoker   Acute hypoxemic respiratory failure ([place])   Bleb, lung ([place])   LOS: 2 days   How to contact the [facility] Attending or Consulting provider [place] or covering provider during after hours [place], for this patient?  1. Check the care team in [facility] and look for a) attending/consulting [facility] provider listed and b) the [facility] team listed 2. Log into [email or web address] and use [place]'s universal password to access. If you do not have the password, please contact the hospital operator. 3. Locate the  [facility] provider you are looking for under [facility] and page to a number that you can be directly reached. 4. If you still have difficulty reaching the provider, please page the [facility] (Director on Call) for the Hospitalists listed on amion for assistance.

## 2019-08-18 NOTE — Progress Notes (Signed)
Gauze dressing around chest tube insertion site saturated with serosanguinous drainage.  New gauze dressing placed.  PA made aware.  Will continue to monitor.

## 2019-08-18 NOTE — Anesthesia Postprocedure Evaluation (Signed)
Anesthesia Post Note  Patient: Jeremy Cunningham  Procedure(s) Performed: VIDEO ASSISTED THORACOSCOPY (Right Chest) STAPLING OF BLEBS (Right ) Intercostal Nerve Block Thoracic (Right ) Pleuradesis     Patient location during evaluation: PACU Anesthesia Type: General Level of consciousness: awake and alert Pain management: pain level controlled Vital Signs Assessment: post-procedure vital signs reviewed and stable Respiratory status: spontaneous breathing, nonlabored ventilation, respiratory function stable and patient connected to nasal cannula oxygen Cardiovascular status: blood pressure returned to baseline and stable Postop Assessment: no apparent nausea or vomiting Anesthetic complications: no    Last Vitals:  Vitals:   08/18/19 1320 08/18/19 1354  BP: 99/68 136/86  Pulse: (!) 103   Resp: 19 19  Temp:    SpO2: 98% 98%    Last Pain:  Vitals:   08/18/19 1134  TempSrc: Oral  PainSc: 0-No pain                 Janai Maudlin S

## 2019-08-18 NOTE — Anesthesia Preprocedure Evaluation (Signed)
Anesthesia Evaluation  Patient identified by MRN, date of birth, ID band Patient awake    Reviewed: Allergy & Precautions, NPO status , Patient's Chart, lab work & pertinent test results  Airway Mallampati: II  TM Distance: >3 FB Neck ROM: Full    Dental no notable dental hx.    Pulmonary neg pulmonary ROS, former smoker,  Current PTX   Pulmonary exam normal breath sounds clear to auscultation + decreased breath sounds      Cardiovascular hypertension, Pt. on medications Normal cardiovascular exam Rhythm:Regular Rate:Normal     Neuro/Psych negative neurological ROS  negative psych ROS   GI/Hepatic negative GI ROS, Neg liver ROS,   Endo/Other  negative endocrine ROS  Renal/GU negative Renal ROS  negative genitourinary   Musculoskeletal negative musculoskeletal ROS (+)   Abdominal   Peds negative pediatric ROS (+)  Hematology negative hematology ROS (+)   Anesthesia Other Findings   Reproductive/Obstetrics negative OB ROS                             Anesthesia Physical Anesthesia Plan  ASA: III  Anesthesia Plan: General   Post-op Pain Management:    Induction: Intravenous  PONV Risk Score and Plan: 2 and Ondansetron, Dexamethasone and Treatment may vary due to age or medical condition  Airway Management Planned: Oral ETT  Additional Equipment:   Intra-op Plan:   Post-operative Plan: Extubation in OR  Informed Consent: I have reviewed the patients History and Physical, chart, labs and discussed the procedure including the risks, benefits and alternatives for the proposed anesthesia with the patient or authorized representative who has indicated his/her understanding and acceptance.     Dental advisory given  Plan Discussed with: CRNA and Surgeon  Anesthesia Plan Comments:         Anesthesia Quick Evaluation

## 2019-08-18 NOTE — Transfer of Care (Signed)
Immediate Anesthesia Transfer of Care Note  Patient: Jeremy Cunningham  Procedure(s) Performed: VIDEO ASSISTED THORACOSCOPY (Right Chest) STAPLING OF BLEBS (Right ) Intercostal Nerve Block Thoracic (Right ) Pleuradesis  Patient Location: PACU  Anesthesia Type:General  Level of Consciousness: awake, alert  and oriented  Airway & Oxygen Therapy: Patient Spontanous Breathing and Patient connected to nasal cannula oxygen  Post-op Assessment: Report given to RN and Post -op Vital signs reviewed and stable  Post vital signs: Reviewed and stable  Last Vitals:  Vitals Value Taken Time  BP 135/98 08/18/19 1032  Temp    Pulse 89 08/18/19 1033  Resp 14 08/18/19 1033  SpO2 94 % 08/18/19 1033  Vitals shown include unvalidated device data.  Last Pain:  Vitals:   08/18/19 0400  TempSrc: Oral  PainSc: 0-No pain         Complications: No apparent anesthesia complications

## 2019-08-18 NOTE — Progress Notes (Addendum)
On 2 LPM of NCL SPO2 96-97%, BP 119/90 - 132/94 mHg, HR 97, normal sinus rhythm on monitor. RR 20-24. Auscultated lungs: left clear,  right basilar diminished. Dry non productive cough, severe pain right chest scale 9-10/10 every time when assessed pain level, however,Pt presented lying down comfortably watching TV and tolerated well, alternated by giving Tylenol, Norco and Morphine 4 mg PRN as needed.  Chest tube ( Pig tail) with continue suction - 20 cmH2O, minimal surous drainage, no subcutaneous emphysema, negative for tachypnea or immediate respiratory distress.  Pre-op checklist done, skin prep with CHG wiped and clipped. Pt signed informed consent for VATS procedure and blood consent. PCR screening result negative. EKG and pre op LAB completed.   Kennyth Lose, RN

## 2019-08-18 NOTE — Brief Op Note (Addendum)
      [address]       [place] [address]             [phone]    08/18/2019  10:26 AM  PATIENT:  Jeremy Cunningham  58 y.o. male  PRE-OPERATIVE DIAGNOSIS:  RECURRENT RIGHT PNEUOTHORAX  POST-OPERATIVE DIAGNOSIS:  RECURRENT RIGHT PNEUOTHORAX  PROCEDURE:  Procedure(s): VIDEO ASSISTED THORACOSCOPY (Right) STAPLING OF BLEBS (Right) Intercostal Nerve Block Thoracic (Right) Pleuradesis  SURGEON:  Surgeon(s) and Role:    * [place] Nakayama, MD - Primary  PHYSICIAN ASSISTANT:  Nicholes Rough, PA-C   ANESTHESIA:   general  EBL:  10 mL   BLOOD ADMINISTERED:none  DRAINS: SINGLE STRAIGHT CHEST TUBE Chest Tube(s) in the RIGHT PLEURAL SPACE   LOCAL MEDICATIONS USED:  BUPIVICAINE   SPECIMEN:  Source of Specimen:  UPPER AND MIDDLE RIGHT LOBE BLEBS  DISPOSITION OF SPECIMEN:  PATHOLOGY  COUNTS:  YES  DICTATION: .Dragon Dictation  PLAN OF CARE: Admit to inpatient   PATIENT DISPOSITION:  PACU - hemodynamically stable.   Delay start of Pharmacological VTE agent (>24hrs) due to surgical blood loss or risk of bleeding: no

## 2019-08-18 NOTE — Op Note (Signed)
NAMEWAGNER, Jeremy Cunningham MEDICAL RECORD [id number] ACCOUNT [id number] DATE OF BIRTH:[birth year] FACILITY: [facility] LOCATION: [facility] PHYSICIAN: Chaya Jan, MD  OPERATIVE REPORT  DATE OF PROCEDURE:  08/18/2019  PREOPERATIVE DIAGNOSIS:  Recurrent right spontaneous pneumothorax.  POSTOPERATIVE DIAGNOSIS:  Recurrent right spontaneous pneumothorax.  PROCEDURE:  Right video-assisted thoracoscopy, bleb resection, pleural abrasion and intercostal nerve blocks- levels 3 through 10.  SURGEON:  Modesto Charon, MD  ASSISTANT:  Nicholes Rough, PA  ANESTHESIA:  General.  FINDINGS:  Ruptured bleb at the right apex.  Additional smaller bleb along the anterior aspect of right upper lobe, tiny blebs along the inferior margin of the upper lobe and lateral margin of the middle lobe.  CLINICAL NOTE:  The patient a 58 year old gentleman who presented with a recurrent right spontaneous pneumothorax.  He had a chest tube placement with reexpansion of the lung, but given the recurrent nature of the problem, he was advised to undergo  surgery for resection of blebs and pleurodesis.  The indications, risks, benefits, and alternatives were discussed in detail with the patient.  He understood and accepted the risks and agreed to proceed.  OPERATIVE NOTE:  Jeremy Cunningham was brought to the preoperative holding area on 08/18/2019.  Anesthesia placed an arterial blood pressure monitor line and established a central venous access.  He was taken to the Operating Room, anesthetized and intubated.   Intravenous antibiotics were administered.  A Foley catheter was placed.  Sequential compression devices were placed on the calves for DVT prophylaxis.  He was placed in a left lateral decubitus position.  Single lung ventilation of the left lung was  initiated and was tolerated well throughout the procedure.  The pigtail catheter was removed.  The right chest was prepped and draped in the usual sterile fashion.  A timeout was  performed.  A solution containing 20 mL of liposomal bupivacaine, 30 mL of 0.5% bupivacaine and 50 mL of saline was prepared.  It was used for local at the incision sites, which were injected before cutting the skin, as well as for the  intercostal nerve blocks.  An incision was made in the eighth interspace in the mid axillary line.  A 5 mm port was inserted into the chest.  The thoracoscope was advanced into the chest.  There was good isolation of the right lung.  A 5 cm working  incision was made in the 4th interspace anterolaterally.  No rib spreading was performed during the procedure.  Intercostal nerve blocks were performed from the third to the tenth interspace, 10 mL of the bupivacaine solution was injected into a subpleural plane at each level.  The upper lobe was inspected.  At the apex there was a ruptured bleb.  An Echelon 45 mm  stapler was used to resect the bleb with a small rim of normal tissue.  There was a second bleb in close proximity, which was included in the specimen.  Along the anterior edge of the right upper lobe, there was a bleb that was not ruptured.  This was  resected as well.  Along the inferior margin of the upper lobe and along the lateral aspect of the inferior margin of the middle lobe, there were a series of small blebs 1-2 mm in size.  These were removed with the stapler as well.  Reinflation of the  lung and reinspection revealed no additional blebs.  The parietal pleural surface then was lightly abraded using a Bovie scratch pad to promote adhesion formation.  A 28-French chest tube  was placed through the original port incision and advanced to the  apex.  It was secured to skin with a #1 silk suture.  Dual-lung ventilation was resumed.  The working incision was closed in standard fashion.  Dermabond was applied.  The chest tube was placed to suction.  The patient then was placed back in a supine  position.  He was extubated in the operating room and taken to the  [facility] in good condition.  TN/NUANCE  D:08/18/2019 T:08/18/2019 JOB:[id number]

## 2019-08-18 NOTE — Anesthesia Procedure Notes (Addendum)
Procedure Name: Intubation Date/Time: 08/18/2019 8:38 AM Performed by: Inda Coke, CRNA Pre-anesthesia Checklist: Patient identified, Emergency Drugs available, Suction available and Patient being monitored Patient Re-evaluated:Patient Re-evaluated prior to induction Oxygen Delivery Method: Circle System Utilized Preoxygenation: Pre-oxygenation with 100% oxygen Induction Type: IV induction Ventilation: Mask ventilation without difficulty Laryngoscope Size: Mac and 4 Grade View: Grade I Tube type: Oral Endobronchial tube: Left and Double lumen EBT and 39 Fr Number of attempts: 1 Airway Equipment and Method: Stylet and Oral airway Placement Confirmation: ETT inserted through vocal cords under direct vision,  positive ETCO2 and breath sounds checked- equal and bilateral Secured at: 29 cm Tube secured with: Tape Dental Injury: Teeth and Oropharynx as per pre-operative assessment  Comments: Intubation by Martinique Ingram SRNA without difficulty

## 2019-08-19 ENCOUNTER — Inpatient Hospital Stay (HOSPITAL_COMMUNITY): Payer: Medicare Other

## 2019-08-19 ENCOUNTER — Encounter (HOSPITAL_COMMUNITY): Payer: Self-pay | Admitting: Thoracic Surgery (Cardiothoracic Vascular Surgery)

## 2019-08-19 LAB — BLOOD GAS, ARTERIAL
Acid-Base Excess: 0.9 mmol/L (ref 0.0–2.0)
Bicarbonate: 24.9 mmol/L (ref 20.0–28.0)
Drawn by: 517021
FIO2: 21
O2 Saturation: 96 %
Patient temperature: 37
pCO2 arterial: 39.1 mmHg (ref 32.0–48.0)
pH, Arterial: 7.42 (ref 7.350–7.450)
pO2, Arterial: 74.1 mmHg — ABNORMAL LOW (ref 83.0–108.0)

## 2019-08-19 LAB — CBC
HCT: 35.5 % — ABNORMAL LOW (ref 39.0–52.0)
Hemoglobin: 11.5 g/dL — ABNORMAL LOW (ref 13.0–17.0)
MCH: 31.3 pg (ref 26.0–34.0)
MCHC: 32.4 g/dL (ref 30.0–36.0)
MCV: 96.5 fL (ref 80.0–100.0)
Platelets: 239 10*3/uL (ref 150–400)
RBC: 3.68 MIL/uL — ABNORMAL LOW (ref 4.22–5.81)
RDW: 12.9 % (ref 11.5–15.5)
WBC: 7.2 10*3/uL (ref 4.0–10.5)
nRBC: 0 % (ref 0.0–0.2)

## 2019-08-19 LAB — BASIC METABOLIC PANEL
Anion gap: 8 (ref 5–15)
BUN: 6 mg/dL (ref 6–20)
CO2: 25 mmol/L (ref 22–32)
Calcium: 9.3 mg/dL (ref 8.9–10.3)
Chloride: 106 mmol/L (ref 98–111)
Creatinine, Ser: 0.72 mg/dL (ref 0.61–1.24)
GFR calc Af Amer: >60 mL/min (ref >60)
GFR calc non Af Amer: >60 mL/min (ref >60)
Glucose, Bld: 101 mg/dL — ABNORMAL HIGH (ref 70–99)
Potassium: 4.2 mmol/L (ref 3.5–5.1)
Sodium: 139 mmol/L (ref 135–145)

## 2019-08-19 LAB — SURGICAL PATHOLOGY

## 2019-08-19 NOTE — Care Management Important Message (Signed)
Important Message  Patient Details  Name: Jeremy Cunningham MRN: [id number] Date of Birth: [birth year]   Medicare Important Message Given:  Yes     Shelda Altes 08/19/2019, 12:56 PM

## 2019-08-19 NOTE — Progress Notes (Addendum)
      [address]       [place] [address]             [phone]      1 Day Post-Op Procedure(s) (LRB): VIDEO ASSISTED THORACOSCOPY (Right) STAPLING OF BLEBS (Right) Intercostal Nerve Block Thoracic (Right) Pleuradesis   Subjective:  Doing well.  Pain is well controlled.  Denies N/V  Objective: Vital signs in last 24 hours: Temp:  [97.2 F (36.2 C)-98.8 F (37.1 C)] 98.6 F (37 C) (10/30 0328) Pulse Rate:  [81-103] 94 (10/30 0328) Cardiac Rhythm: Sinus tachycardia (10/29 2017) Resp:  [10-21] 20 (10/30 0441) BP: (99-148)/(68-103) 140/85 (10/30 0328) SpO2:  [94 %-98 %] 95 % (10/30 0441)  Intake/Output from previous day: 10/29 0701 - 10/30 0700 In: 4077.5 [P.O.:720; I.V.:3237.5] Out: 4031 [Urine:3875; Blood:10; Chest Tube:146]  General appearance: alert, cooperative and no distress Heart: regular rate and rhythm Lungs: clear to auscultation bilaterally Abdomen: soft, non-tender; bowel sounds normal; no masses,  no organomegaly Extremities: extremities normal, atraumatic, no cyanosis or edema Wound: clean and dry  Lab Results: Recent Labs    08/17/19 2056 08/19/19 0244  WBC 3.1* 7.2  HGB 11.8* 11.5*  HCT 36.8* 35.5*  PLT 228 239   BMET:  Recent Labs    08/17/19 2056 08/19/19 0244  NA 139 139  K 4.2 4.2  CL 109 106  CO2 22 25  GLUCOSE 104* 101*  BUN 9 6  CREATININE 0.91 0.72  CALCIUM 9.0 9.3    PT/INR:  Recent Labs    08/17/19 2056  LABPROT 12.7  INR 1.0   ABG    Component Value Date/Time   PHART 7.420 08/19/2019 0422   HCO3 24.9 08/19/2019 0422   O2SAT 96.0 08/19/2019 0422   CBG (last 3)  Recent Labs    08/18/19 1153  GLUCAP 98    Assessment/Plan: S/P Procedure(s) (LRB): VIDEO ASSISTED THORACOSCOPY (Right) STAPLING OF BLEBS (Right) Intercostal Nerve Block Thoracic (Right) Pleuradesis  1. CV-NSR, BP elevated at times- on home Norvasc 2. Pulm- no acute issues, CT output has been low, no air leak is present,  CXR with small lateral pneumothorax, + atelectasis-- likely place chest tube to water seal 3. Renal- creatinine WNL, K is WNL, place IV fluids to KVO 4. DVT prophylaxis on Lovenox 5. Dispo- patient stable, no air leak present, possibly place chest tube to water seal today, on home Norvasc for HTN, repeat CXR in AM   LOS: 3 days    Ellwood Handler, PA-C 08/19/2019 Patient seen and examined, agree with above CT to water seal, hopefully can dc tomorrow Ambulate, SCD + enoxaparin for DVT prophylaxis  Remo Lipps C. Roxan Hockey, MD [facility] [phone]

## 2019-08-19 NOTE — Progress Notes (Signed)
PROGRESS NOTE    Jeremy Cunningham  [id number] DOB: [birth year] DOA: 08/16/2019 PCP: Vonna Drafts, FNP   Brief Narrative: 58 year old male with spontaneous pneumothorax history with shortness of breath and chest pain on 10/26, was seen by pulmonology on 10/27 found to have large right spontaneous pneumothorax and sent to the ER and admitted for further management  Subjective:  Mild rt sided chest pain on using IS. On o2 this am, no fever,m cough. Feeling better No acute events overnight  Assessment & Plan:   Recurrent pneumothorax right-sided, status post chest tube placement in the ER-unclear whether spontaneous given some bullous changes noted on previous CT. followed by CT surgery.  Status post video-assisted thorascopic procedure and stapling of blebs, pleurodesis.  Acute hypoxic respiratory failure due to above wean oxygen as tolerated.  HTN (hypertension): Blood pressure stable.  On amlodipine  Former smoker- no more smoking x4 months  Pulmonary bulla CT previously showed bullous changes but PFTs were reassuring.  Continue Combivent as needed, follow-up with pulmonary as outpatient.    Body mass index is 28.19 kg/m.    DVT prophylaxis: SCD/Lovenox Code Status: full Family Communication: plan of care discussed with patient at bedside. Disposition Plan: Remains inpatient pending clinical improvement.   Consultants: ct surgery  Procedures:  10/27 chest tube insertion right 10/29 Status post video-assisted thorascopic procedure, bleb resection and stapling of blebs, pleurodesis.   10/27 chest x-ray large right pneumothorax  10/27 CT chest small residual pneumothorax with chest tube in place. Microbiology:  Antimicrobials: Anti-infectives (From admission, onward)   Start     Dose/Rate Route Frequency Ordered Stop   08/18/19 1700  ceFAZolin (ANCEF) IVPB 2g/100 mL premix     2 g 200 mL/hr over 30 Minutes Intravenous Every 8 hours 08/18/19 1133 08/19/19 0058   08/18/19 0745  ceFAZolin (ANCEF) IVPB 2g/100 mL premix     2 g 200 mL/hr over 30 Minutes Intravenous To Surgery 08/17/19 2040 08/18/19 0844       Objective: Vitals:   08/18/19 2026 08/19/19 0026 08/19/19 0328 08/19/19 0441  BP:   140/85   Pulse:   94   Resp: 10 14 20 20   Temp:   98.6 F (37 C)   TempSrc:   Oral   SpO2: 97% 95% 95% 95%  Weight:      Height:        Intake/Output Summary (Last 24 hours) at 08/19/2019 0842 Last data filed at 08/19/2019 0543 Gross per 24 hour  Intake 4077.5 ml  Output 4031 ml  Net 46.5 ml   Filed Weights   08/16/19 1526 08/17/19 0055 08/18/19 0544  Weight: 82.1 kg 82.5 kg 86.6 kg   Weight change:   Body mass index is 28.19 kg/m.  Intake/Output from previous day: 10/29 0701 - 10/30 0700 In: 4077.5 [P.O.:720; I.V.:3237.5] Out: [id number] [Urine:3875; Blood:10; Chest Tube:146] Intake/Output this shift: No intake/output data recorded.  Examination:  General exam: AAO,NAD HEENT:Oral mucosa moist, Ear/Nose WNL grossly, dentition normal. Respiratory system: Diminished at the rt side with conducted breath sounds, no use of accessory muscle Cardiovascular system: S1 & S2 +, No JVD,. Gastrointestinal system: Abdomen soft, NT,ND, BS+ Nervous System:Alert, awake, moving extremities and grossly nonfocal Extremities: No edema, distal peripheral pulses palpable.  Skin: No rashes,no icterus. MSK: Normal muscle bulk,tone, power  Medications:  Scheduled Meds: . acetaminophen  1,000 mg Oral Q6H   Or  . acetaminophen (TYLENOL) oral liquid 160 mg/5 mL  1,000 mg Oral Q6H  . amLODipine  5  mg Oral Daily  . bisacodyl  10 mg Oral Daily  . Chlorhexidine Gluconate Cloth  6 each Topical Daily  . enoxaparin (LOVENOX) injection  40 mg Subcutaneous Daily  . morphine   Intravenous Q4H  . senna-docusate  1 tablet Oral QHS   Continuous Infusions: . sodium chloride 100 mL/hr at 08/18/19 1311    Data Reviewed: I have personally reviewed following labs and  imaging studies  CBC: Recent Labs  Lab 08/16/19 1526 08/17/19 0317 08/17/19 2056 08/19/19 0244  WBC 3.4* 3.4* 3.1* 7.2  NEUTROABS 1.9  --   --   --   HGB 14.8 12.6* 11.8* 11.5*  HCT 43.8 38.1* 36.8* 35.5*  MCV 92.4 96.9 97.4 96.5  PLT 237 208 228 [id number]   Basic Metabolic Panel: Recent Labs  Lab 08/16/19 1526 08/17/19 0317 08/17/19 2056 08/19/19 0244  NA 136 135 139 139  K 3.8 3.9 4.2 4.2  CL 101 103 109 106  CO2 22 24 22 25   GLUCOSE 106* 91 104* 101*  BUN 8 10 9 6   CREATININE 1.02 1.00 0.91 0.72  CALCIUM 10.2 9.0 9.0 9.3   GFR: Estimated Creatinine Clearance: 112.4 mL/min (by C-G formula based on SCr of 0.72 mg/dL). Liver Function Tests: Recent Labs  Lab 08/17/19 2056  AST 29  ALT 25  ALKPHOS 45  BILITOT 0.5  PROT 5.8*  ALBUMIN 3.2*   No results for input(s): LIPASE, AMYLASE in the last 168 hours. No results for input(s): AMMONIA in the last 168 hours. Coagulation Profile: Recent Labs  Lab 08/16/19 1526 08/17/19 2056  INR 1.0 1.0   Cardiac Enzymes: No results for input(s): CKTOTAL, CKMB, CKMBINDEX, TROPONINI in the last 168 hours. BNP (last 3 results) No results for input(s): PROBNP in the last 8760 hours. HbA1C: No results for input(s): HGBA1C in the last 72 hours. CBG: Recent Labs  Lab 08/18/19 1153  GLUCAP 98   Lipid Profile: No results for input(s): CHOL, HDL, LDLCALC, TRIG, CHOLHDL, LDLDIRECT in the last 72 hours. Thyroid Function Tests: No results for input(s): TSH, T4TOTAL, FREET4, T3FREE, THYROIDAB in the last 72 hours. Anemia Panel: No results for input(s): VITAMINB12, FOLATE, FERRITIN, TIBC, IRON, RETICCTPCT in the last 72 hours. Sepsis Labs: No results for input(s): PROCALCITON, LATICACIDVEN in the last 168 hours.  Recent Results (from the past 240 hour(s))  SARS CORONAVIRUS 2 (TAT 6-24 HRS) Nasopharyngeal Nasopharyngeal Swab     Status: None   Collection Time: 08/16/19  3:40 PM   Specimen: Nasopharyngeal Swab  Result Value Ref  Range Status   SARS Coronavirus 2 NEGATIVE NEGATIVE Final    Comment: (NOTE) SARS-CoV-2 target nucleic acids are NOT DETECTED. The SARS-CoV-2 RNA is generally detectable in upper and lower respiratory specimens during the acute phase of infection. Negative results do not preclude SARS-CoV-2 infection, do not rule out co-infections with other pathogens, and should not be used as the sole basis for treatment or other patient management decisions. Negative results must be combined with clinical observations, patient history, and epidemiological information. The expected result is Negative. Fact Sheet for Patients: SugarRoll.be Fact Sheet for Healthcare Providers: https://www.woods-mathews.com/ This test is not yet approved or cleared by the [place] FDA and  has been authorized for detection and/or diagnosis of SARS-CoV-2 by FDA under an Emergency Use Authorization (EUA). This EUA will remain  in effect (meaning this test can be used) for the duration of the COVID-19 declaration under Section 56 4(b)(1) of the Act, 21 U.S.C. section 360bbb-3(b)(1), unless the  authorization is terminated or revoked sooner. Performed at [facility], [place] [address] [place], [place] [address]   Surgical pcr screen     Status: None   Collection Time: 08/17/19 12:16 AM   Specimen: Nasal Mucosa; Nasal Swab  Result Value Ref Range Status   MRSA, PCR NEGATIVE NEGATIVE Final   Staphylococcus aureus NEGATIVE NEGATIVE Final    Comment: (NOTE) The Xpert SA Assay (FDA approved for NASAL specimens in patients 32 years of age and older), is one component of a comprehensive surveillance program. It is not intended to diagnose infection nor to guide or monitor treatment. Performed at [facility], [place] [address] [place], [place] [address]       Radiology Studies: Dg Chest Port 1 View  Result Date: 08/19/2019 CLINICAL DATA:  Chest tube assessment.   Pneumothorax. EXAM: PORTABLE CHEST 1 VIEW COMPARISON:  08/18/2019 FINDINGS: Right apical chest tube unchanged. Lungs are hypoinflated and demonstrate possible subtle pleural line over the lateral right thorax which may represent a tiny pneumothorax. Surgical sutures over the medial right apex. Subtle patchy density over the right mid to upper lung likely atelectasis. Minimal blunting of the left costophrenic angle which may represent a small amount of pleural fluid or atelectasis. Cardiomediastinal silhouette and remainder of the exam is unchanged. IMPRESSION: Postsurgical change over the right apex with minimal patchy density over the right mid to upper lung likely atelectasis. Possible tiny right lateral pneumothorax. Small amount left pleural fluid versus atelectasis. Electronically Signed   By: Marin Olp M.D.   On: 08/19/2019 07:30   Dg Chest Port 1 View  Result Date: 08/18/2019 CLINICAL DATA:  Status post VATS procedure or recurrent right pneumothorax. EXAM: PORTABLE CHEST 1 VIEW COMPARISON:  08/17/2019 FINDINGS: New surgical staples are noted in the right lung apex. Right chest tube has been exchanged. No visible right pneumothorax. Heart size and vascularity are normal. Minimal atelectasis at the left lung base. No acute bone abnormality. IMPRESSION: 1. No pneumothorax. 2. Minimal atelectasis at the left lung base. 3. New surgical staples in the right lung apex. Electronically Signed   By: Lorriane Shire M.D.   On: 08/18/2019 11:12   Dg Chest Port 1 View  Result Date: 08/17/2019 CLINICAL DATA:  Right-sided chest tube, pneumothorax EXAM: PORTABLE CHEST 1 VIEW COMPARISON:  08/16/2019 FINDINGS: Right-sided pigtail chest tube remains in position about the lateral right hemithorax. No significant residual pneumothorax. There is increased conspicuity of irregular heterogeneous opacities of the right midlung. The left lung is normally aerated. IMPRESSION: 1. Right-sided pigtail chest tube remains in  position about the lateral right hemithorax. No significant residual pneumothorax. 2. There is increased conspicuity of irregular heterogeneous opacities of the right midlung, which may reflect atelectasis or developing airspace disease. Electronically Signed   By: Eddie Candle M.D.   On: 08/17/2019 10:10      LOS: 3 days   Time spent: More than 50% of that time was spent in counseling and/or coordination of care.  Antonieta Pert, MD [facility]  08/19/2019, 8:42 AM

## 2019-08-20 ENCOUNTER — Inpatient Hospital Stay (HOSPITAL_COMMUNITY): Payer: Medicare Other

## 2019-08-20 LAB — COMPREHENSIVE METABOLIC PANEL
ALT: 20 U/L (ref 0–44)
AST: 21 U/L (ref 15–41)
Albumin: 2.9 g/dL — ABNORMAL LOW (ref 3.5–5.0)
Alkaline Phosphatase: 37 U/L — ABNORMAL LOW (ref 38–126)
Anion gap: 10 (ref 5–15)
BUN: 8 mg/dL (ref 6–20)
CO2: 27 mmol/L (ref 22–32)
Calcium: 9.2 mg/dL (ref 8.9–10.3)
Chloride: 103 mmol/L (ref 98–111)
Creatinine, Ser: 0.87 mg/dL (ref 0.61–1.24)
GFR calc Af Amer: >60 mL/min (ref >60)
GFR calc non Af Amer: >60 mL/min (ref >60)
Glucose, Bld: 120 mg/dL — ABNORMAL HIGH (ref 70–99)
Potassium: 3.7 mmol/L (ref 3.5–5.1)
Sodium: 140 mmol/L (ref 135–145)
Total Bilirubin: 0.3 mg/dL (ref 0.3–1.2)
Total Protein: 5.4 g/dL — ABNORMAL LOW (ref 6.5–8.1)

## 2019-08-20 LAB — CBC
HCT: 34.8 % — ABNORMAL LOW (ref 39.0–52.0)
Hemoglobin: 11.3 g/dL — ABNORMAL LOW (ref 13.0–17.0)
MCH: 31.5 pg (ref 26.0–34.0)
MCHC: 32.5 g/dL (ref 30.0–36.0)
MCV: 96.9 fL (ref 80.0–100.0)
Platelets: 259 10*3/uL (ref 150–400)
RBC: 3.59 MIL/uL — ABNORMAL LOW (ref 4.22–5.81)
RDW: 13.2 % (ref 11.5–15.5)
WBC: 5.6 10*3/uL (ref 4.0–10.5)
nRBC: 0 % (ref 0.0–0.2)

## 2019-08-20 NOTE — Progress Notes (Signed)
PCA pump D/Ced today.  26 mL of morphine wasted in Steri cycle with Donella Stade RN.

## 2019-08-20 NOTE — Progress Notes (Signed)
PROGRESS NOTE    Jeremy Cunningham  [id number] DOB: 14-Apr-1961 DOA: 08/16/2019 PCP: Vonna Drafts, FNP   Brief Narrative: 58 year old male with spontaneous pneumothorax history with shortness of breath and chest pain on 10/26, was seen by pulmonology on 10/27 found to have large right spontaneous pneumothorax and sent to the ER and admitted for further management  Subjective: C/o pain in chest at tube site. No acute events overnight.  No fever.  WBC at 5.6K. on RA. CXR Pending this morning " they are going to take tube out after cxr"  Assessment & Plan:   Recurrent pneumothorax right-sided, status post chest tube placement in the ER-unclear whether spontaneous given some bullous changes noted on previous CT. patient under CT surgery service.  Status post video-assisted thorascopic procedure and stapling of blebs, pleurodesis.  Repeat chest x-ray pending today, chest tube to waterseal since 10/30 and noted plan to d/c chest tube and PCA today- ambualte and possible d/c in am as per CT surgery  Acute hypoxic respiratory failure due to above: resolve.d on RA.  HTN: Blood pressure controlled on amlodipine 5 mg.   Former smoker- no more smoking x4 months  Pulmonary bulla CT previously showed bullous changes but PFTs were reassuring.  Continue Combivent as needed, follow-up with pulmonary as outpatient.    Body mass index is 28.19 kg/m.    DVT prophylaxis: SCD/Lovenox Code Status: full Family Communication: plan of care discussed with patient at bedside. Disposition Plan: Continue chest tube plan and further disposition as per primary CT surgery.  Hospitalist team will continue to follow while patient is hospitalized.    Consultants: ct surgery  Procedures:  10/27 chest tube insertion right 10/29 Status post video-assisted thorascopic procedure, bleb resection and stapling of blebs, pleurodesis.   10/27 chest x-ray large right pneumothorax  10/27 CT chest small residual  pneumothorax with chest tube in place. Microbiology:  Antimicrobials: Anti-infectives (From admission, onward)   Start     Dose/Rate Route Frequency Ordered Stop   08/18/19 1700  ceFAZolin (ANCEF) IVPB 2g/100 mL premix     2 g 200 mL/hr over 30 Minutes Intravenous Every 8 hours 08/18/19 1133 08/19/19 0058   08/18/19 0745  ceFAZolin (ANCEF) IVPB 2g/100 mL premix     2 g 200 mL/hr over 30 Minutes Intravenous To Surgery 08/17/19 2040 08/18/19 0844       Objective: Vitals:   08/19/19 2000 08/19/19 2220 08/20/19 0400 08/20/19 0402  BP:  125/87  (!) 137/99  Pulse:  91    Resp: (!) 29 20 (!) 21 (!) 23  Temp:  98.6 F (37 C)  98.5 F (36.9 C)  TempSrc:  Oral  Oral  SpO2: 93% 92% 91% 94%  Weight:      Height:        Intake/Output Summary (Last 24 hours) at 08/20/2019 0716 Last data filed at 08/20/2019 0311 Gross per 24 hour  Intake -  Output 1400 ml  Net -1400 ml   Filed Weights   08/16/19 1526 08/17/19 0055 08/18/19 0544  Weight: 82.1 kg 82.5 kg 86.6 kg   Weight change:   Body mass index is 28.19 kg/m.  Intake/Output from previous day: 10/30 0701 - 10/31 0700 In: -  Out: 1400 [Urine:1400] Intake/Output this shift: No intake/output data recorded.  Examination:  General exam: AAO,NAD HEENT:Oral mucosa moist, Ear/Nose WNL grossly, dentition normal. Respiratory system: Diminished  BS at rt base, clear otherwise.  Cardiovascular system: S1 & S2 +, No JVD,. Gastrointestinal system: Abdomen  soft, NT,ND, BS+ Nervous System:Alert, awake, moving extremities and grossly nonfocal Extremities: No edema, distal peripheral pulses palpable.  Skin: No rashes,no icterus. MSK: Normal muscle bulk,tone, power  Medications:  Scheduled Meds: . acetaminophen  1,000 mg Oral Q6H   Or  . acetaminophen (TYLENOL) oral liquid 160 mg/5 mL  1,000 mg Oral Q6H  . amLODipine  5 mg Oral Daily  . bisacodyl  10 mg Oral Daily  . Chlorhexidine Gluconate Cloth  6 each Topical Daily  .  enoxaparin (LOVENOX) injection  40 mg Subcutaneous Daily  . morphine   Intravenous Q4H  . senna-docusate  1 tablet Oral QHS   Continuous Infusions: . sodium chloride 10 mL/hr at 08/19/19 1140    Data Reviewed: I have personally reviewed following labs and imaging studies  CBC: Recent Labs  Lab 08/16/19 1526 08/17/19 0317 08/17/19 2056 08/19/19 0244 08/20/19 0248  WBC 3.4* 3.4* 3.1* 7.2 5.6  NEUTROABS 1.9  --   --   --   --   HGB 14.8 12.6* 11.8* 11.5* 11.3*  HCT 43.8 38.1* 36.8* 35.5* 34.8*  MCV 92.4 96.9 97.4 96.5 96.9  PLT 237 208 228 239 [id number]   Basic Metabolic Panel: Recent Labs  Lab 08/16/19 1526 08/17/19 0317 08/17/19 2056 08/19/19 0244 08/20/19 0248  NA 136 135 139 139 140  K 3.8 3.9 4.2 4.2 3.7  CL 101 103 109 106 103  CO2 22 24 22 25 27   GLUCOSE 106* 91 104* 101* 120*  BUN 8 10 9 6 8   CREATININE 1.02 1.00 0.91 0.72 0.87  CALCIUM 10.2 9.0 9.0 9.3 9.2   GFR: Estimated Creatinine Clearance: 103.4 mL/min (by C-G formula based on SCr of 0.87 mg/dL). Liver Function Tests: Recent Labs  Lab 08/17/19 2056 08/20/19 0248  AST 29 21  ALT 25 20  ALKPHOS 45 37*  BILITOT 0.5 0.3  PROT 5.8* 5.4*  ALBUMIN 3.2* 2.9*   No results for input(s): LIPASE, AMYLASE in the last 168 hours. No results for input(s): AMMONIA in the last 168 hours. Coagulation Profile: Recent Labs  Lab 08/16/19 1526 08/17/19 2056  INR 1.0 1.0   Cardiac Enzymes: No results for input(s): CKTOTAL, CKMB, CKMBINDEX, TROPONINI in the last 168 hours. BNP (last 3 results) No results for input(s): PROBNP in the last 8760 hours. HbA1C: No results for input(s): HGBA1C in the last 72 hours. CBG: Recent Labs  Lab 08/18/19 1153  GLUCAP 98   Lipid Profile: No results for input(s): CHOL, HDL, LDLCALC, TRIG, CHOLHDL, LDLDIRECT in the last 72 hours. Thyroid Function Tests: No results for input(s): TSH, T4TOTAL, FREET4, T3FREE, THYROIDAB in the last 72 hours. Anemia Panel: No results for  input(s): VITAMINB12, FOLATE, FERRITIN, TIBC, IRON, RETICCTPCT in the last 72 hours. Sepsis Labs: No results for input(s): PROCALCITON, LATICACIDVEN in the last 168 hours.  Recent Results (from the past 240 hour(s))  SARS CORONAVIRUS 2 (TAT 6-24 HRS) Nasopharyngeal Nasopharyngeal Swab     Status: None   Collection Time: 08/16/19  3:40 PM   Specimen: Nasopharyngeal Swab  Result Value Ref Range Status   SARS Coronavirus 2 NEGATIVE NEGATIVE Final    Comment: (NOTE) SARS-CoV-2 target nucleic acids are NOT DETECTED. The SARS-CoV-2 RNA is generally detectable in upper and lower respiratory specimens during the acute phase of infection. Negative results do not preclude SARS-CoV-2 infection, do not rule out co-infections with other pathogens, and should not be used as the sole basis for treatment or other patient management decisions. Negative results must be  combined with clinical observations, patient history, and epidemiological information. The expected result is Negative. Fact Sheet for Patients: SugarRoll.be Fact Sheet for Healthcare Providers: https://www.woods-mathews.com/ This test is not yet approved or cleared by the [place] FDA and  has been authorized for detection and/or diagnosis of SARS-CoV-2 by FDA under an Emergency Use Authorization (EUA). This EUA will remain  in effect (meaning this test can be used) for the duration of the COVID-19 declaration under Section 56 4(b)(1) of the Act, 21 U.S.C. section 360bbb-3(b)(1), unless the authorization is terminated or revoked sooner. Performed at [facility], [place] [address] [place], [place] [address]   Surgical pcr screen     Status: None   Collection Time: 08/17/19 12:16 AM   Specimen: Nasal Mucosa; Nasal Swab  Result Value Ref Range Status   MRSA, PCR NEGATIVE NEGATIVE Final   Staphylococcus aureus NEGATIVE NEGATIVE Final    Comment: (NOTE) The Xpert SA Assay (FDA  approved for NASAL specimens in patients 37 years of age and older), is one component of a comprehensive surveillance program. It is not intended to diagnose infection nor to guide or monitor treatment. Performed at [facility], [place] [address] [place], [place] [address]       Radiology Studies: Dg Chest Port 1 View  Result Date: 08/19/2019 CLINICAL DATA:  Chest tube assessment.  Pneumothorax. EXAM: PORTABLE CHEST 1 VIEW COMPARISON:  08/18/2019 FINDINGS: Right apical chest tube unchanged. Lungs are hypoinflated and demonstrate possible subtle pleural line over the lateral right thorax which may represent a tiny pneumothorax. Surgical sutures over the medial right apex. Subtle patchy density over the right mid to upper lung likely atelectasis. Minimal blunting of the left costophrenic angle which may represent a small amount of pleural fluid or atelectasis. Cardiomediastinal silhouette and remainder of the exam is unchanged. IMPRESSION: Postsurgical change over the right apex with minimal patchy density over the right mid to upper lung likely atelectasis. Possible tiny right lateral pneumothorax. Small amount left pleural fluid versus atelectasis. Electronically Signed   By: Marin Olp M.D.   On: 08/19/2019 07:30   Dg Chest Port 1 View  Result Date: 08/18/2019 CLINICAL DATA:  Status post VATS procedure or recurrent right pneumothorax. EXAM: PORTABLE CHEST 1 VIEW COMPARISON:  08/17/2019 FINDINGS: New surgical staples are noted in the right lung apex. Right chest tube has been exchanged. No visible right pneumothorax. Heart size and vascularity are normal. Minimal atelectasis at the left lung base. No acute bone abnormality. IMPRESSION: 1. No pneumothorax. 2. Minimal atelectasis at the left lung base. 3. New surgical staples in the right lung apex. Electronically Signed   By: Lorriane Shire M.D.   On: 08/18/2019 11:12      LOS: 4 days   Time spent: More than 50% of that time was spent  in counseling and/or coordination of care.  Antonieta Pert, MD [facility]  08/20/2019, 7:16 AM

## 2019-08-20 NOTE — Progress Notes (Addendum)
[address]       ,[place] [address]             [phone]      2 Days Post-Op Procedure(s) (LRB): VIDEO ASSISTED THORACOSCOPY (Right) STAPLING OF BLEBS (Right) Intercostal Nerve Block Thoracic (Right) Pleuradesis Subjective: Some incisional/chest tube discomfort, but not too bad  Objective: Vital signs in last 24 hours: Temp:  [98.2 F (36.8 C)-98.6 F (37 C)] 98.6 F (37 C) (10/31 0742) Pulse Rate:  [91-95] 91 (10/30 2220) Cardiac Rhythm: (P) Sinus tachycardia (10/31 0742) Resp:  [17-33] 17 (10/31 0742) BP: (125-146)/(87-99) 130/96 (10/31 0742) SpO2:  [91 %-95 %] 93 % (10/31 0742)  Hemodynamic parameters for last 24 hours:    Intake/Output from previous day: 10/30 0701 - 10/31 0700 In: -  Out: 1630 [Urine:1400; Chest Tube:230] Intake/Output this shift: No intake/output data recorded.  General appearance: alert, cooperative and no distress Heart: regular rate and rhythm Lungs: mildly dim in right lower fields Abdomen: benign Extremities: no pedal edema Wound: incis healing well  Lab Results: Recent Labs    08/19/19 0244 08/20/19 0248  WBC 7.2 5.6  HGB 11.5* 11.3*  HCT 35.5* 34.8*  PLT 239 259   BMET:  Recent Labs    08/19/19 0244 08/20/19 0248  NA 139 140  K 4.2 3.7  CL 106 103  CO2 25 27  GLUCOSE 101* 120*  BUN 6 8  CREATININE 0.72 0.87  CALCIUM 9.3 9.2    PT/INR:  Recent Labs    08/17/19 2056  LABPROT 12.7  INR 1.0   ABG    Component Value Date/Time   PHART 7.420 08/19/2019 0422   HCO3 24.9 08/19/2019 0422   O2SAT 96.0 08/19/2019 0422   CBG (last 3)  Recent Labs    08/18/19 1153  GLUCAP 98    Meds Scheduled Meds: . acetaminophen  1,000 mg Oral Q6H   Or  . acetaminophen (TYLENOL) oral liquid 160 mg/5 mL  1,000 mg Oral Q6H  . amLODipine  5 mg Oral Daily  . bisacodyl  10 mg Oral Daily  . Chlorhexidine Gluconate Cloth  6 each Topical Daily  . enoxaparin (LOVENOX) injection  40 mg Subcutaneous  Daily  . morphine   Intravenous Q4H  . senna-docusate  1 tablet Oral QHS   Continuous Infusions: . sodium chloride 10 mL/hr at 08/19/19 1140   PRN Meds:.diphenhydrAMINE **OR** diphenhydrAMINE, ketorolac, naloxone **AND** sodium chloride flush, ondansetron (ZOFRAN) IV, oxyCODONE, traMADol  Xrays Dg Chest Port 1 View  Result Date: 08/19/2019 CLINICAL DATA:  Chest tube assessment.  Pneumothorax. EXAM: PORTABLE CHEST 1 VIEW COMPARISON:  08/18/2019 FINDINGS: Right apical chest tube unchanged. Lungs are hypoinflated and demonstrate possible subtle pleural line over the lateral right thorax which may represent a tiny pneumothorax. Surgical sutures over the medial right apex. Subtle patchy density over the right mid to upper lung likely atelectasis. Minimal blunting of the left costophrenic angle which may represent a small amount of pleural fluid or atelectasis. Cardiomediastinal silhouette and remainder of the exam is unchanged. IMPRESSION: Postsurgical change over the right apex with minimal patchy density over the right mid to upper lung likely atelectasis. Possible tiny right lateral pneumothorax. Small amount left pleural fluid versus atelectasis. Electronically Signed   By: Marin Olp M.D.   On: 08/19/2019 07:30   Dg Chest Port 1 View  Result Date: 08/18/2019 CLINICAL DATA:  Status post VATS procedure or recurrent right pneumothorax. EXAM: PORTABLE CHEST 1  VIEW COMPARISON:  08/17/2019 FINDINGS: New surgical staples are noted in the right lung apex. Right chest tube has been exchanged. No visible right pneumothorax. Heart size and vascularity are normal. Minimal atelectasis at the left lung base. No acute bone abnormality. IMPRESSION: 1. No pneumothorax. 2. Minimal atelectasis at the left lung base. 3. New surgical staples in the right lung apex. Electronically Signed   By: Lorriane Shire M.D.   On: 08/18/2019 11:12    Assessment/Plan: S/P Procedure(s) (LRB): VIDEO ASSISTED THORACOSCOPY  (Right) STAPLING OF BLEBS (Right) Intercostal Nerve Block Thoracic (Right) Pleuradesis  1 doing well 2 hemodyn stable in sinus rhythm, some HTN, norvasc restarted yesterday 3 sats ok on RA 4 labs are stable 5 CT- no air leak 6 CXR, no pntx 7 will d/c chest tube and PCA 8 needs to ambulate, cont routine pulm toilet 9 poss d/c in am   LOS: 4 days    John Giovanni PA-C 08/20/2019 Pager [phone] - not for patient use  Agree with above CT removed Home likely tomorrow  Lajuana Matte

## 2019-08-20 NOTE — Progress Notes (Signed)
Chest tube removed per order. VSS. Patient tolerated well. X ray informed to obtain CXR.   Arletta Bale, RN

## 2019-08-21 ENCOUNTER — Inpatient Hospital Stay (HOSPITAL_COMMUNITY): Payer: Medicare Other

## 2019-08-21 MED ORDER — TRAMADOL HCL 50 MG PO TABS: 50.0000 mg | ORAL_TABLET | Freq: Four times a day (QID) | ORAL | 0 refills | Status: AC | PRN

## 2019-08-21 NOTE — Progress Notes (Signed)
Pt discharged today with wife to home.  Both pt's IV removed and patient taken off telemetry, CCMD notified.  Pt left with all of their personal belongings.  AVS documentation reviewed with pt and all questions answered.

## 2019-08-21 NOTE — Progress Notes (Addendum)
[address]       [place] [address]             [phone]      3 Days Post-Op Procedure(s) (LRB): VIDEO ASSISTED THORACOSCOPY (Right) STAPLING OF BLEBS (Right) Intercostal Nerve Block Thoracic (Right) Pleuradesis Subjective: Feels ok, minor discomforts  Objective: Vital signs in last 24 hours: Temp:  [99.4 F (37.4 C)] 99.4 F (37.4 C) (10/31 1932) Pulse Rate:  [94] 94 (10/31 1932) Cardiac Rhythm: Normal sinus rhythm (11/01 0700) Resp:  [13-29] 19 (10/31 1932) BP: (121-156)/(87-108) 122/87 (10/31 1932) SpO2:  [92 %-95 %] 95 % (10/31 1932)  Hemodynamic parameters for last 24 hours:    Intake/Output from previous day: 10/31 0701 - 11/01 0700 In: -  Out: 650 [Urine:650] Intake/Output this shift: No intake/output data recorded.  General appearance: alert, cooperative and no distress Heart: regular rate and rhythm Lungs: min dim in left base Abdomen: benign Extremities: no calf tenderness or edema Wound: incis healing well  Lab Results: Recent Labs    08/19/19 0244 08/20/19 0248  WBC 7.2 5.6  HGB 11.5* 11.3*  HCT 35.5* 34.8*  PLT 239 259   BMET:  Recent Labs    08/19/19 0244 08/20/19 0248  NA 139 140  K 4.2 3.7  CL 106 103  CO2 25 27  GLUCOSE 101* 120*  BUN 6 8  CREATININE 0.72 0.87  CALCIUM 9.3 9.2    PT/INR: No results for input(s): LABPROT, INR in the last 72 hours. ABG    Component Value Date/Time   PHART 7.420 08/19/2019 0422   HCO3 24.9 08/19/2019 0422   O2SAT 96.0 08/19/2019 0422   CBG (last 3)  Recent Labs    08/18/19 1153  GLUCAP 98    Meds Scheduled Meds: . acetaminophen  1,000 mg Oral Q6H   Or  . acetaminophen (TYLENOL) oral liquid 160 mg/5 mL  1,000 mg Oral Q6H  . amLODipine  5 mg Oral Daily  . bisacodyl  10 mg Oral Daily  . Chlorhexidine Gluconate Cloth  6 each Topical Daily  . enoxaparin (LOVENOX) injection  40 mg Subcutaneous Daily  . senna-docusate  1 tablet Oral QHS   Continuous  Infusions: . sodium chloride 10 mL/hr at 08/19/19 1140   PRN Meds:.oxyCODONE, traMADol  Xrays Dg Chest Port 1 View  Result Date: 08/20/2019 CLINICAL DATA:  Status post right-sided chest tube removal, history of spontaneous pneumothorax EXAM: PORTABLE CHEST 1 VIEW COMPARISON:  08/20/2019, 7:49 a.m. FINDINGS: Interval removal of a previously seen right-sided chest tube. There is no significant pneumothorax. Surgical suture about the right pulmonary apex. Otherwise unchanged low volume AP portable examination. No new airspace opacity. IMPRESSION: Interval removal of a previously seen right-sided chest tube. There is no significant pneumothorax. Electronically Signed   By: Eddie Candle M.D.   On: 08/20/2019 13:07   Dg Chest Port 1 View  Result Date: 08/20/2019 CLINICAL DATA:  Status post lung surgery. EXAM: PORTABLE CHEST 1 VIEW COMPARISON:  08/19/2019 FINDINGS: Stable right chest tube. The previously demonstrated possible tiny right lateral pneumothorax is no longer seen. Stable right apical surgical staples. Interval mild increase in left basilar atelectasis. Interval mild patchy density in the right lower lung zone. Grossly normal sized heart. Unremarkable bones. IMPRESSION: 1. Stable right chest tube. 2. Mild increase in left basilar atelectasis. 3. Interval mild patchy atelectasis or pneumonia in the right lower lung zone. 4. No pneumothorax. Electronically Signed   By: Remo Lipps  Joneen Caraway M.D.   On: 08/20/2019 09:47    Assessment/Plan: S/P Procedure(s) (LRB): VIDEO ASSISTED THORACOSCOPY (Right) STAPLING OF BLEBS (Right) Intercostal Nerve Block Thoracic (Right) Pleuradesis  1 conts to do well 2 hemodyn stable in sinus rhythm 3 sats good on RA 4 CXR stable without pntx 5 stable for d/c home  LOS: 5 days    John Giovanni PA-C 08/21/2019 Pager [phone]- not for patient use  Agree with above Home today  [place]

## 2019-08-29 ENCOUNTER — Other Ambulatory Visit: Payer: Self-pay | Admitting: Thoracic Surgery (Cardiothoracic Vascular Surgery)

## 2019-08-29 DIAGNOSIS — J939 Pneumothorax, unspecified: Secondary | ICD-10-CM

## 2019-08-30 ENCOUNTER — Other Ambulatory Visit: Payer: Self-pay

## 2019-08-30 ENCOUNTER — Encounter: Payer: Self-pay | Admitting: Thoracic Surgery (Cardiothoracic Vascular Surgery)

## 2019-08-30 ENCOUNTER — Ambulatory Visit (INDEPENDENT_AMBULATORY_CARE_PROVIDER_SITE_OTHER): Payer: Self-pay | Admitting: Thoracic Surgery (Cardiothoracic Vascular Surgery)

## 2019-08-30 ENCOUNTER — Ambulatory Visit
Admission: RE | Admit: 2019-08-30 | Discharge: 2019-08-30 | Disposition: A | Discharge status: Home / Self Care | Payer: Medicare Other | Source: Ambulatory Visit | Attending: Thoracic Surgery (Cardiothoracic Vascular Surgery) | Admitting: Thoracic Surgery (Cardiothoracic Vascular Surgery)

## 2019-08-30 VITALS — BP 135/83 | HR 82 | Temp 96.6°F | Resp 16 | Ht 68.5 in | Wt 187.8 lb

## 2019-08-30 DIAGNOSIS — Z09 Encounter for follow-up examination after completed treatment for conditions other than malignant neoplasm: Secondary | ICD-10-CM

## 2019-08-30 DIAGNOSIS — J439 Emphysema, unspecified: Secondary | ICD-10-CM

## 2019-08-30 DIAGNOSIS — J939 Pneumothorax, unspecified: Secondary | ICD-10-CM

## 2019-08-30 NOTE — Progress Notes (Signed)
[address]       [place] [address]             [phone]       HPI: Jeremy Cunningham returns for scheduled follow-up visit  Jeremy Cunningham is a 58 year old gentleman recently admitted with a recurrent right spontaneous pneumothorax.  I did a right VATS for bleb resection and pleural abrasion on 08/18/2019.  He did well postoperatively and went home on day 3.  He has had some soreness.  He takes oxycodone once or twice a day.  He has not been using it lately.  He has not had any respiratory issues.  Past Medical History:  Diagnosis Date  . Arthritis    back  . Hyperlipidemia   . Spontaneous pneumothorax      Current Outpatient Medications  Medication Sig Dispense Refill  . amLODipine (NORVASC) 5 MG tablet Take 5 mg by mouth daily.    . fenofibrate (TRICOR) 145 MG tablet Take 145 mg by mouth daily.    . Ipratropium-Albuterol (COMBIVENT RESPIMAT) 20-100 MCG/ACT AERS respimat Inhale 1 puff into the lungs every 6 (six) hours as needed for wheezing or shortness of breath. 4 g 1  . omega-3 acid ethyl esters (LOVAZA) 1 g capsule Take 1 capsule by mouth daily.     Marland Kitchen triamcinolone cream (KENALOG) 0.5 % Apply 1 application topically 2 (two) times daily as needed (for rashes).      No current facility-administered medications for this visit.     Physical Exam BP 135/83 (BP Location: Left Arm, Patient Position: Sitting, Cuff Size: Normal)   Pulse 82   Temp (!) 96.6 F (35.9 C)   Resp 16   Ht 5' 8.5" (1.74 m)   Wt 187 lb 12.8 oz (85.2 kg)   SpO2 96% Comment: RA  BMI 28.40 kg/m  58 year old man in no acute distress Alert and oriented x3 with no focal deficits Lungs clear with equal breath sounds bilaterally Incisions healing well  Diagnostic Tests: CHEST - 2 VIEW  COMPARISON:  Radiograph 08/21/2019  FINDINGS: Previous right pneumothorax has diminished, possible tiny residual laterally versus pleural thickening/scarring. Chain sutures in the right mid  lung and right lung apex. Unchanged mild volume loss in the right hemithorax with unchanged blunting of right costophrenic angle. No acute airspace disease. Unchanged heart size and mediastinal contours. No pulmonary edema. No acute osseous abnormalities are seen.  IMPRESSION: 1. Decreased right pneumothorax since exam 9 days ago, possible tiny residual laterally versus pleural thickening/scarring. 2. Postsurgical change in the right lung. Diminished bilateral pleural effusions.   Electronically Signed   By: Keith Rake M.D.   On: 08/30/2019 14:48 I personally reviewed the chest x-ray.  There is no pneumothorax there is some pleural thickening laterally.  Impression: Jeremy Cunningham is a 58 year old gentleman with a past history of tobacco abuse who presented with a recurrent spontaneous pneumothorax.  He underwent right VATS for bleb resection and pleural abrasion on 08/18/2019.  His postoperative course was uncomplicated and he went home on day 3.  Spontaneous pneumothorax-he does have a bleb on the left upper lobe so is at risk for pneumothorax on the opposite side.  Importance of tobacco cessation was stressed.  Tobacco abuse-quit smoking after his first pneumothorax in July of this year.  Doing well from a postoperative standpoint.  He may resume driving.  He was cautioned not to drive within 6 hours of taking a pain pill.  He is anxious  to return to work.  He works at [employer] and does have to do some lifting.  I recommended he wait until December 1 before going back to work.  Plan: I will be happy to see Jeremy Cunningham back at anytime in the future if I can be of any further assistance with his care.  Melrose Nakayama, MD [facility] [phone]

## 2019-09-19 ENCOUNTER — Ambulatory Visit: Payer: Medicare Other | Admitting: Pulmonary Disease

## 2019-09-19 NOTE — Progress Notes (Signed)
@Patient  ID: Jeremy Cunningham, male    DOB: 02/10/1961, 58 y.o.   MRN: [id number]  Chief Complaint  Patient presents with  . Follow-up    F/U for thoracoscopy from Dr. Roxan Hockey. States he is still recovering, still has some right sided pain.     Referring provider: Vonna Drafts, FNP  HPI:  58 year old male former smoker consulted with our practice on 05/12/2019 on arrival to hospital for worsening shortness of breath and found to have large right pneumothorax with complete collapse of right lung, requiring urgent thoracostomy.  PMH:  hypertension, hyperlipidemia, DDD Smoker/ Smoking History: Former Smoker. Quit 05/11/2019. 12.5 pack year.  Maintenance:  None Pt of: Dr. Tamala Julian   09/20/2019  - Visit   58 year old male former smoker followed in our office for recurrent right pneumothorax.  Patient was last evaluated in our office on 08/16/2019 where a repeat chest x-ray revealed another right-sided pneumothorax -100% right-sided pneumothorax.  Patient was routed to the emergency room.  Patient was hospitalized for 5 days.  He was discharged on 08/21/2019.  Patient was consulted with [facility] with Dr. Roxan Hockey.  Patient is status post right VATS on 08/17/2019 for bleb resection and pleural abrasion due to high risk of recurrent pneumothorax.  Patient has already completed an outpatient follow-up with Dr. Koleen Nimrod on 08/30/2019.  At that point in time patient was felt to be clinically stable and he is able to follow-up as needed with [facility].  He was encouraged to return to work on 09/20/2019.  He was still utilizing an occasional pain pill at that time.  He was also encouraged to resume driving.  Patient presenting to office today for a follow-up visit since being discharged from the hospital.  Patient reports he has been doing quite well.  He continues to not smoke.  He reports that he has been doing quite well.  He would like to go back to work.  He has been cleared by [facility] to return to work.  He  still has some occasional discomfort on his right side.  He does not believe that this is painful.  He still can proceed forward and do the rest of his day-to-day activities.  He is not having to use his pain medications.  Questionaires / Pulmonary Flowsheets:   MMRC: mMRC Dyspnea Scale mMRC Score  09/20/2019 1    Tests:   05/25/2019-SARS-CoV-2-negative  05/12/2019-HIV-nonreactive 05/12/2019-alpha-1- 102 05/12/2019-d-dimer-0.57  05/19/2019-CT chest without contrast- right chest tube is identified, there is minimal right pneumothorax as described significantly smaller compared to prior chest x-ray of July/29/2020  05/12/2019-chest x-ray- complete collapse of right lung with near large right pneumothorax, question small nodule left lung base this could be further evaluated with an elective chest CT when patient is able  05/13/2019-echocardiogram- LV ejection fraction 60 to 65%, moderate basilar septal hypertrophy, right ventricle has normal systolic function and cavity is normal there is no increase in right ventricular wall thickness  05/30/2019-pulmonary function test-FVC 3.68 (95% predicted), postbronchodilator ratio 75, postbronchodilator FEV1 2.87 (94% predicted), no bronchodilator response, DLCO 24.35 (90% predicted)  08/16/2019-chest x-ray-large right pneumothorax with complete collapse of right lung  08/16/2019-CT chest without contrast-right-sided pigtail chest tube in place, small residual right-sided pneumothorax, scattered patchy groundglass opacities throughout the lung, unchanged small bullae in the right lung apex, unchanged tiny calcified granulomas in the right middle lobe  08/30/2019-chest x-ray-decreased right-sided pneumothorax since exam 9 days ago, possible tiny residual laterally versus pleural thickening/scarring, postsurgical change in the right lung, diminished bilateral pleural  effusions  FENO:  No results found for: NITRICOXIDE  PFT: PFT Results Latest Ref Rng &  Units 05/30/2019  FVC-Pre L 3.68  FVC-Predicted Pre % 95  FVC-Post L 3.80  FVC-Predicted Post % 98  Pre FEV1/FVC % % 76  Post FEV1/FCV % % 75  FEV1-Pre L 2.78  FEV1-Predicted Pre % 91  FEV1-Post L 2.87  DLCO UNC% % 90  DLCO COR %Predicted % 103  TLC L 6.82  TLC % Predicted % 102  RV % Predicted % 152    WALK:  SIX MIN WALK 05/30/2019  Supplimental Oxygen during Test? (L/min) No  Tech Comments: Moderate to rapid walk - no desaturation - no complaints - tolerated without difficulty - Joella Prince RN    Imaging: Dg Chest 2 View  Result Date: 08/30/2019 CLINICAL DATA:  Pneumothorax. Right VATS 08/18/2019 with stapling of blebs. Right chest pain today. EXAM: CHEST - 2 VIEW COMPARISON:  Radiograph 08/21/2019 FINDINGS: Previous right pneumothorax has diminished, possible tiny residual laterally versus pleural thickening/scarring. Chain sutures in the right mid lung and right lung apex. Unchanged mild volume loss in the right hemithorax with unchanged blunting of right costophrenic angle. No acute airspace disease. Unchanged heart size and mediastinal contours. No pulmonary edema. No acute osseous abnormalities are seen. IMPRESSION: 1. Decreased right pneumothorax since exam 9 days ago, possible tiny residual laterally versus pleural thickening/scarring. 2. Postsurgical change in the right lung. Diminished bilateral pleural effusions. Electronically Signed   By: Keith Rake M.D.   On: 08/30/2019 14:48    Lab Results:  CBC    Component Value Date/Time   WBC 5.6 08/20/2019 0248   RBC 3.59 (L) 08/20/2019 0248   HGB 11.3 (L) 08/20/2019 0248   HCT 34.8 (L) 08/20/2019 0248   PLT 259 08/20/2019 0248   MCV 96.9 08/20/2019 0248   MCH 31.5 08/20/2019 0248   MCHC 32.5 08/20/2019 0248   RDW 13.2 08/20/2019 0248   LYMPHSABS 1.0 08/16/2019 1526   MONOABS 0.4 08/16/2019 1526   EOSABS 0.1 08/16/2019 1526   BASOSABS 0.0 08/16/2019 1526    BMET    Component Value Date/Time   NA 140  08/20/2019 0248   K 3.7 08/20/2019 0248   CL 103 08/20/2019 0248   CO2 27 08/20/2019 0248   GLUCOSE 120 (H) 08/20/2019 0248   BUN 8 08/20/2019 0248   CREATININE 0.87 08/20/2019 0248   CALCIUM 9.2 08/20/2019 0248   GFRNONAA >60 08/20/2019 0248   GFRAA >60 08/20/2019 0248    BNP No results found for: BNP  ProBNP No results found for: PROBNP  Specialty Problems      Pulmonary Problems   Pneumothorax    05/12/2019-chest x-ray- complete collapse of right lung with near large right pneumothorax, question small nodule left lung base this could be further evaluated with an elective chest CT when patient is able  05/19/2019-CT chest without contrast- right chest tube is identified, there is minimal right pneumothorax as described significantly smaller compared to prior chest x-ray of July/29/2020       Recurrent pneumothorax    05/12/2019-chest x-ray- complete collapse of right lung with near large right pneumothorax, question small nodule left lung base this could be further evaluated with an elective chest CT when patient is able  July/2020 right-sided pneumothorax treated with chest tube, patient stop smoking  08/16/2019-chest x-ray-large right pneumothorax with complete collapse of right lung  October/2020 right-sided pneumothorax treated with right VATS, bleb resection, pleural abrasion by CVTS Dr. Roxan Hockey  Acute hypoxemic respiratory failure (HCC)   Bleb, lung (HCC)      No Known Allergies  Immunization History  Administered Date(s) Administered  . Influenza,inj,Quad PF,6+ Mos 07/05/2019    Past Medical History:  Diagnosis Date  . Arthritis    back  . Hyperlipidemia   . Spontaneous pneumothorax     Tobacco History: Social History   Tobacco Use  Smoking Status Former Smoker  . Packs/day: 0.50  . Years: 25.00  . Pack years: 12.50  . Types: Cigarettes  . Start date: 10/29/1993  . Quit date: 05/11/2019  . Years since quitting: 0.3  Smokeless Tobacco  Never Used  Tobacco Comment   quit May 11, 2019   Counseling given: Yes Comment: quit May 11, 2019  Continue to not smoke  Outpatient Encounter Medications as of 09/20/2019  Medication Sig  . amLODipine (NORVASC) 5 MG tablet Take 5 mg by mouth daily.  . fenofibrate (TRICOR) 145 MG tablet Take 145 mg by mouth daily.  Marland Kitchen omega-3 acid ethyl esters (LOVAZA) 1 g capsule Take 1 capsule by mouth daily.   . traMADol (ULTRAM) 50 MG tablet Take 50 mg by mouth 2 (two) times daily as needed.  . triamcinolone cream (KENALOG) 0.5 % Apply 1 application topically 2 (two) times daily as needed (for rashes).   . Ipratropium-Albuterol (COMBIVENT RESPIMAT) 20-100 MCG/ACT AERS respimat Inhale 1 puff into the lungs every 6 (six) hours as needed for wheezing or shortness of breath.   No facility-administered encounter medications on file as of 09/20/2019.      Review of Systems  Review of Systems  Constitutional: Negative for activity change, chills, fatigue, fever and unexpected weight change.  HENT: Negative for postnasal drip, rhinorrhea, sinus pressure, sinus pain and sore throat.   Eyes: Negative.   Respiratory: Negative for cough, shortness of breath and wheezing.   Cardiovascular: Negative for chest pain and palpitations.  Gastrointestinal: Negative for constipation, diarrhea, nausea and vomiting.  Endocrine: Negative.   Genitourinary: Negative.   Musculoskeletal: Negative.   Skin: Negative.   Neurological: Negative for dizziness and headaches.  Psychiatric/Behavioral: Negative.  Negative for dysphoric mood. The patient is not nervous/anxious.   All other systems reviewed and are negative.    Physical Exam  BP 132/88 (BP Location: Left Arm, Patient Position: Sitting, Cuff Size: Normal)   Pulse 96   Temp 98.2 F (36.8 C) (Temporal)   Ht 5' 8.5" (1.74 m)   Wt 189 lb 6.4 oz (85.9 kg)   SpO2 98%   BMI 28.38 kg/m   Wt Readings from Last 5 Encounters:  09/20/19 189 lb 6.4 oz (85.9 kg)   08/30/19 187 lb 12.8 oz (85.2 kg)  08/18/19 190 lb 14.4 oz (86.6 kg)  08/16/19 181 lb 3.2 oz (82.2 kg)  05/30/19 191 lb (86.6 kg)    BMI Readings from Last 5 Encounters:  09/20/19 28.38 kg/m  08/30/19 28.14 kg/m  08/18/19 28.19 kg/m  08/16/19 27.15 kg/m  05/30/19 28.62 kg/m     Physical Exam Vitals signs and nursing note reviewed.  Constitutional:      General: He is not in acute distress.    Appearance: Normal appearance. He is normal weight.  HENT:     Head: Normocephalic and atraumatic.     Right Ear: Hearing, tympanic membrane, ear canal and external ear normal.     Left Ear: Hearing, tympanic membrane, ear canal and external ear normal.     Nose: Nose normal. No mucosal edema or rhinorrhea.  Right Turbinates: Not enlarged.     Left Turbinates: Not enlarged.     Mouth/Throat:     Mouth: Mucous membranes are dry.     Pharynx: Oropharynx is clear. No oropharyngeal exudate.  Eyes:     Pupils: Pupils are equal, round, and reactive to light.  Neck:     Musculoskeletal: Normal range of motion.  Cardiovascular:     Rate and Rhythm: Normal rate and regular rhythm.     Pulses: Normal pulses.     Heart sounds: Normal heart sounds. No murmur.  Pulmonary:     Effort: Pulmonary effort is normal. No respiratory distress.     Breath sounds: Normal breath sounds. No decreased breath sounds, wheezing or rales.  Musculoskeletal:     Right lower leg: No edema.     Left lower leg: No edema.  Lymphadenopathy:     Cervical: No cervical adenopathy.  Skin:    General: Skin is warm and dry.     Capillary Refill: Capillary refill takes less than 2 seconds.     Findings: No erythema or rash.  Neurological:     General: No focal deficit present.     Mental Status: He is alert and oriented to person, place, and time.     Motor: No weakness.     Coordination: Coordination normal.     Gait: Gait is intact. Gait (Tolerated walk in office today) normal.  Psychiatric:        Mood  and Affect: Mood normal.        Behavior: Behavior normal. Behavior is cooperative.        Thought Content: Thought content normal.        Judgment: Judgment normal.       Assessment & Plan:   Recurrent pneumothorax Plan: Continue to follow-up with [facility]TS as needed Walk today in office Continue to not smoke Okay to return to work Do not lift things heavier than 20 pounds Follow-up with our office in 6 to 8 weeks, can consider repeat chest x-ray at that point in time  Former smoker Plan: Continue to not smoke    Return in about 6 weeks (around 11/01/2019), or if symptoms worsen or fail to improve, for Follow up with Dr. Tamala Julian.   Lauraine Rinne, NP 09/20/2019   This appointment was 26 minutes long with over 50% of the time in direct face-to-face patient care, assessment, plan of care, and follow-up.

## 2019-09-20 ENCOUNTER — Ambulatory Visit (INDEPENDENT_AMBULATORY_CARE_PROVIDER_SITE_OTHER): Payer: Medicare Other | Admitting: Pulmonary Disease

## 2019-09-20 ENCOUNTER — Encounter: Payer: Self-pay | Admitting: Pulmonary Disease

## 2019-09-20 ENCOUNTER — Other Ambulatory Visit: Payer: Self-pay

## 2019-09-20 VITALS — BP 132/88 | HR 96 | Temp 98.2°F | Ht 68.5 in | Wt 189.4 lb

## 2019-09-20 DIAGNOSIS — Z87891 Personal history of nicotine dependence: Secondary | ICD-10-CM | POA: Diagnosis not present

## 2019-09-20 DIAGNOSIS — J939 Pneumothorax, unspecified: Secondary | ICD-10-CM

## 2019-09-20 NOTE — Patient Instructions (Addendum)
You were seen today by Lauraine Rinne, NP  for:   1. Pneumothorax, unspecified type  Okay to return back to work Okay to drive Still be careful with heavy lifting, should not lift anything heavier than 20 pounds Continue to not smoke Follow-up with our office in 6 weeks with Dr. Tamala Julian  2. Former smoker  Continue to not smoke  Follow Up:    Return in about 6 weeks (around 11/01/2019), or if symptoms worsen or fail to improve, for Follow up with Dr. Tamala Julian.   Please do your part to reduce the spread of COVID-19:      Reduce your risk of any infection  and COVID19 by using the similar precautions used for avoiding the common cold or flu:  Marland Kitchen Wash your hands often with soap and warm water for at least 20 seconds.  If soap and water are not readily available, use an alcohol-based hand sanitizer with at least 60% alcohol.  . If coughing or sneezing, cover your mouth and nose by coughing or sneezing into the elbow areas of your shirt or coat, into a tissue or into your sleeve (not your hands). Langley Gauss A MASK when in public  . Avoid shaking hands with others and consider head nods or verbal greetings only. . Avoid touching your eyes, nose, or mouth with unwashed hands.  . Avoid close contact with people who are sick. . Avoid places or events with large numbers of people in one location, like concerts or sporting events. . If you have some symptoms but not all symptoms, continue to monitor at home and seek medical attention if your symptoms worsen. . If you are having a medical emergency, call 911.   [place] / e-Visit: [email or web address]         [facility]: [facility]: [id number]                   [facility]: [id number]     It is flu season:   >>> Best ways to protect herself from the flu: Receive the yearly flu vaccine,  practice good hand hygiene washing with soap and also using hand sanitizer when available, eat a nutritious meals, get adequate rest, hydrate appropriately   Please contact the office if your symptoms worsen or you have concerns that you are not improving.   Thank you for choosing [facility] for your healthcare, and for allowing [place] to partner with you on your healthcare journey. I am thankful to be able to provide care to you today.   Wyn Quaker FNP-C    Pneumothorax A pneumothorax is commonly called a collapsed lung. It is a condition in which air leaks from a lung and builds up between the thin layer of tissue that covers the lungs (visceral pleura) and the interior wall of the chest cavity (parietal pleura). The air gets trapped outside the lung, between the lung and the chest wall (pleural space). The air takes up space and prevents the lung from fully expanding. This condition sometimes occurs suddenly with no apparent cause. The buildup of air may be small or large. A small pneumothorax may go away on its own. A large pneumothorax will require treatment and hospitalization. What are the causes? This condition may be caused by:  Trauma and injury to the chest wall.  Surgery and other medical procedures.  A complication of an underlying lung problem,  especially chronic obstructive pulmonary disease (COPD) or emphysema. Sometimes the cause of this condition is not known. What increases the risk? You are more likely to develop this condition if:  You have an underlying lung problem.  You smoke.  You are 38-47 years old, male, tall, and underweight.  You have a personal or family history of pneumothorax.  You have an eating disorder (anorexia nervosa). This condition can also happen quickly, even in people with no history of lung problems. What are the signs or symptoms? Sometimes a pneumothorax will have no symptoms. When symptoms are present, they can  include:  Chest pain.  Shortness of breath.  Increased rate of breathing.  Bluish color to your lips or skin (cyanosis). How is this diagnosed? This condition may be diagnosed by:  A medical history and physical exam.  A chest X-ray, chest CT scan, or ultrasound. How is this treated? Treatment depends on how severe your condition is. The goal of treatment is to remove the extra air and allow your lung to expand back to its normal size.  For a small pneumothorax: ? No treatment may be needed. ? Extra oxygen is sometimes used to make it go away more quickly.  For a large pneumothorax or a pneumothorax that is causing symptoms, a procedure is done to drain the air from your lungs. To do this, a health care provider may use: ? A needle with a syringe. This is used to suck air from a pleural space where no additional leakage is taking place. ? A chest tube. This is used to suck air where there is ongoing leakage into the pleural space. The chest tube may need to remain in place for several days until the air leak has healed.  In more severe cases, surgery may be needed to repair the damage that is causing the leak.  If you have multiple pneumothorax episodes or have an air leak that will not heal, a procedure called a pleurodesis may be done. A medicine is placed in the pleural space to irritate the tissues around the lung so that the lung will stick to the chest wall, seal any leaks, and stop any buildup of air in that space. If you have an underlying lung problem, severe symptoms, or a large pneumothorax you will usually need to stay in the hospital. Follow these instructions at home: Lifestyle  Do not use any products that contain nicotine or tobacco, such as cigarettes and e-cigarettes. These are major risk factors in pneumothorax. If you need help quitting, ask your health care provider.  Do not lift anything that is heavier than 10 lb (4.5 kg), or the limit that your health care  provider tells you, until he or she says that it is safe.  Avoid activities that take a lot of effort (strenuous) for as long as told by your health care provider.  Return to your normal activities as told by your health care provider. Ask your health care provider what activities are safe for you.  Do not fly in an airplane or scuba dive until your health care provider says it is okay. General instructions  Take over-the-counter and prescription medicines only as told by your health care provider.  If a cough or pain makes it difficult for you to sleep at night, try sleeping in a semi-upright position in a recliner or by using 2 or 3 pillows.  If you had a chest tube and it was removed, ask your health care provider when  you can remove the bandage (dressing). While the dressing is in place, do not allow it to get wet.  Keep all follow-up visits as told by your health care provider. This is important. Contact a health care provider if:  You cough up thick mucus (sputum) that is yellow or green in color.  You were treated with a chest tube, and you have redness, increasing pain, or discharge at the site where it was placed. Get help right away if:  You have increasing chest pain or shortness of breath.  You have a cough that will not go away.  You begin coughing up blood.  You have pain that is getting worse or is not controlled with medicines.  The site where your chest tube was located opens up.  You feel air coming out of the site where the chest tube was placed.  You have a fever or persistent symptoms for more than 2-3 days.  You have a fever and your symptoms suddenly get worse. These symptoms may represent a serious problem that is an emergency. Do not wait to see if the symptoms will go away. Get medical help right away. Call your local emergency services (911 in the [place] Do not drive yourself to the hospital. Summary  A pneumothorax, commonly called a collapsed  lung, is a condition in which air leaks from a lung and gets trapped between the lung and the chest wall (pleural space).  The buildup of air may be small or large. A small pneumothorax may go away on its own. A large pneumothorax will require treatment and hospitalization.  Treatment for this condition depends on how severe the pneumothorax is. The goal of treatment is to remove the extra air and allow the lung to expand back to its normal size. This information is not intended to replace advice given to you by your health care provider. Make sure you discuss any questions you have with your health care provider. Document Released: 10/06/2005 Document Revised: 09/18/2017 Document Reviewed: 09/14/2017 Elsevier Patient Education  2020 [employer].

## 2019-09-20 NOTE — Assessment & Plan Note (Signed)
Plan: Continue to not smoke 

## 2019-09-20 NOTE — Assessment & Plan Note (Signed)
Plan: Continue to follow-up with [facility]TS as needed Walk today in office Continue to not smoke Okay to return to work Do not lift things heavier than 20 pounds Follow-up with our office in 6 to 8 weeks, can consider repeat chest x-ray at that point in time

## 2019-12-05 NOTE — Progress Notes (Signed)
Pulmonary f/u note 12/06/19  S: Here for f/u of recurrent pneumothorax s/p blebectomy and pleurodesis on 08/18/19.  Former smoker.  Doing great.  MMRC 0.  Occasional pain at site of incision for VATS but this is improving over time.  ROS + symptoms in bold Fevers, chills, weight loss Nausea, vomiting, diarrhea Shortness of breath, wheezing, cough Chest pain, palpitations, lower ext edema   O:  Today's Vitals   12/06/19 1417  BP: 118/78  Pulse: 97  Temp: 97.6 F (36.4 C)  TempSrc: Temporal  SpO2: 96%  Weight: 199 lb 3.2 oz (90.4 kg)  Height: 5\' 9"  (1.753 m)   Body mass index is 29.42 kg/m.  GEN: thin man in NAD HEENT: mask on CV: RRR, ext warm PULM: Rhonci bilaterally, no accessory muscle use GI: Soft, +BS EXT: No edema NEURO: normal speech, moves all 4 ext PSYCH: AOx3, fair insight   A: # Recurrent spontaneous R PTX s/p blebectomy and surgical pleurodesis # Former smoker- 25 pack years, does not qualify for LDCT ca screening # Rhonci on exam- denies any dyspnea  P: In office CXR- clear Continue to increase activity Touch base in 1 year, if doing well at that time can be PRN   MDM . I reviewed prior external note(s) from Dr. Roxan Hockey on 08/30/19 . I reviewed the result(s) of pulmonary function tests 05/30/19 showing no obstructive defect, +air trapping, and normal DLCO   Erskine Emery MD PCCM

## 2019-12-06 ENCOUNTER — Encounter: Payer: Self-pay | Admitting: Internal Medicine

## 2019-12-06 ENCOUNTER — Ambulatory Visit (INDEPENDENT_AMBULATORY_CARE_PROVIDER_SITE_OTHER): Payer: Medicare Other

## 2019-12-06 ENCOUNTER — Ambulatory Visit (INDEPENDENT_AMBULATORY_CARE_PROVIDER_SITE_OTHER): Payer: Medicare Other | Admitting: Internal Medicine

## 2019-12-06 ENCOUNTER — Other Ambulatory Visit: Payer: Self-pay

## 2019-12-06 VITALS — BP 118/78 | HR 97 | Temp 97.6°F | Ht 69.0 in | Wt 199.2 lb

## 2019-12-06 DIAGNOSIS — J939 Pneumothorax, unspecified: Secondary | ICD-10-CM

## 2019-12-06 NOTE — Patient Instructions (Addendum)
X-ray looks good Continue activity as tolerated Touch base with [place] in 1 year then we can see eachother as needed

## 2019-12-19 DIAGNOSIS — H40013 Open angle with borderline findings, low risk, bilateral: Secondary | ICD-10-CM | POA: Diagnosis not present

## 2019-12-19 DIAGNOSIS — H2513 Age-related nuclear cataract, bilateral: Secondary | ICD-10-CM | POA: Diagnosis not present

## 2020-02-29 DIAGNOSIS — M1611 Unilateral primary osteoarthritis, right hip: Secondary | ICD-10-CM | POA: Diagnosis not present

## 2020-02-29 DIAGNOSIS — M25551 Pain in right hip: Secondary | ICD-10-CM | POA: Diagnosis not present

## 2020-02-29 DIAGNOSIS — Z96642 Presence of left artificial hip joint: Secondary | ICD-10-CM | POA: Diagnosis not present

## 2020-02-29 DIAGNOSIS — Z471 Aftercare following joint replacement surgery: Secondary | ICD-10-CM | POA: Diagnosis not present

## 2020-04-04 DIAGNOSIS — M25551 Pain in right hip: Secondary | ICD-10-CM | POA: Diagnosis not present

## 2020-04-24 DIAGNOSIS — E782 Mixed hyperlipidemia: Secondary | ICD-10-CM | POA: Diagnosis not present

## 2020-04-24 DIAGNOSIS — M25559 Pain in unspecified hip: Secondary | ICD-10-CM | POA: Diagnosis not present

## 2020-04-24 DIAGNOSIS — Z0000 Encounter for general adult medical examination without abnormal findings: Secondary | ICD-10-CM | POA: Diagnosis not present

## 2020-04-24 DIAGNOSIS — I10 Essential (primary) hypertension: Secondary | ICD-10-CM | POA: Diagnosis not present

## 2020-04-24 DIAGNOSIS — R7301 Impaired fasting glucose: Secondary | ICD-10-CM | POA: Diagnosis not present

## 2020-04-24 DIAGNOSIS — R5382 Chronic fatigue, unspecified: Secondary | ICD-10-CM | POA: Diagnosis not present

## 2020-11-26 ENCOUNTER — Ambulatory Visit: Payer: Medicare Other | Admitting: Adult Health

## 2020-12-03 ENCOUNTER — Ambulatory Visit (INDEPENDENT_AMBULATORY_CARE_PROVIDER_SITE_OTHER): Payer: Medicare HMO | Admitting: Adult Health

## 2020-12-03 ENCOUNTER — Other Ambulatory Visit: Payer: Self-pay

## 2020-12-03 ENCOUNTER — Encounter: Payer: Self-pay | Admitting: Adult Health

## 2020-12-03 ENCOUNTER — Ambulatory Visit (INDEPENDENT_AMBULATORY_CARE_PROVIDER_SITE_OTHER): Payer: Medicare HMO

## 2020-12-03 VITALS — BP 124/80 | HR 105 | Temp 97.2°F | Ht 69.0 in | Wt 178.2 lb

## 2020-12-03 DIAGNOSIS — J939 Pneumothorax, unspecified: Secondary | ICD-10-CM | POA: Diagnosis not present

## 2020-12-03 NOTE — Patient Instructions (Signed)
Activity as tolerated .  Chest xray today .  Follow up with pulmonary as needed.  Keep follow up with Primary MD .

## 2020-12-03 NOTE — Progress Notes (Signed)
@Patient  ID: Jeremy Cunningham, male    DOB: June 09, 1961, 60 y.o.   MRN: [id number]  Chief Complaint  Patient presents with  . Follow-up    Referring provider: Vonna Drafts, FNP  HPI: 60 year old male never smoker with history of recurrent spontaneous right pneumothorax status post blebectomy and surgical pleurodesis 2020  TEST/EVENTS :  PFTs May 30, 2019 FEV1 94%, ratio 75, FVC 98%, no significant bronchodilator response, DLCO 90% CT chest August 16, 2019 right-sided pigtail chest tube.  Small residual right-sided pneumothorax.  Unchanged small bullae of the right lung apex and medial anterior left upper lobe  12/03/2020 Follow up : History of recurrent spontaneous right pneumothorax status post blebectomy and surgical pleurodesis Patient returns for a 1 year follow-up.  Patient underwent blebectomy and surgical pleurodesis in October 2020.  Patient says he is done very well since surgery.  He feels well.  He has no known complications.  Says he remains very active Works at [employer]. Exercises on routine basis . Fully vaccinated.  Patient was seen 1 year ago.  Patient's chest x-ray at that time showed stable surgical changes involving the right upper lobe.  He had no residual recurrent pneumothorax.  Patient denies any chest pain, shortness of breath.   Says he has a good appetite.   No Known Allergies  Immunization History  Administered Date(s) Administered  . Influenza,inj,Quad PF,6+ Mos 07/05/2019, 07/19/2020  . PFIZER SARS-COV-2 Pediatric Vaccination 5-51yrs 03/26/2020, 09/25/2020  . PFIZER(Purple Top)SARS-COV-2 Vaccination 03/05/2020    Past Medical History:  Diagnosis Date  . Arthritis    back  . Hyperlipidemia   . Spontaneous pneumothorax     Tobacco History: Social History   Tobacco Use  Smoking Status Former Smoker  . Packs/day: 0.50  . Years: 25.00  . Pack years: 12.50  . Types: Cigarettes  . Start date: 10/29/1993  . Quit date: 05/11/2019  .  Years since quitting: 1.5  Smokeless Tobacco Never Used  Tobacco Comment   quit May 11, 2019   Counseling given: Not Answered Comment: quit May 11, 2019   Outpatient Medications Prior to Visit  Medication Sig Dispense Refill  . amLODipine (NORVASC) 5 MG tablet Take 5 mg by mouth daily.    . fenofibrate (TRICOR) 145 MG tablet Take 145 mg by mouth daily.    Marland Kitchen omega-3 acid ethyl esters (LOVAZA) 1 g capsule Take 1 capsule by mouth daily.     . traMADol (ULTRAM) 50 MG tablet Take 50 mg by mouth 2 (two) times daily as needed.    . triamcinolone cream (KENALOG) 0.5 % Apply 1 application topically 2 (two) times daily as needed (for rashes).     . Ipratropium-Albuterol (COMBIVENT RESPIMAT) 20-100 MCG/ACT AERS respimat Inhale 1 puff into the lungs every 6 (six) hours as needed for wheezing or shortness of breath. 4 g 1   No facility-administered medications prior to visit.     Review of Systems:   Constitutional:   No  weight loss, night sweats,  Fevers, chills, fatigue, or  lassitude.  HEENT:   No headaches,  Difficulty swallowing,  Tooth/dental problems, or  Sore throat,                No sneezing, itching, ear ache, nasal congestion, post nasal drip,   CV:  No chest pain,  Orthopnea, PND, swelling in lower extremities, anasarca, dizziness, palpitations, syncope.   GI  No heartburn, indigestion, abdominal pain, nausea, vomiting, diarrhea, change in bowel habits, loss of  appetite, bloody stools.   Resp: No shortness of breath with exertion or at rest.  No excess mucus, no productive cough,  No non-productive cough,  No coughing up of blood.  No change in color of mucus.  No wheezing.  No chest wall deformity  Skin: no rash or lesions.  GU: no dysuria, change in color of urine, no urgency or frequency.  No flank pain, no hematuria   MS:  No joint pain or swelling.  No decreased range of motion.  No back pain.    Physical Exam  BP 124/80 (BP Location: Left Arm, Cuff Size: Normal)    Pulse (!) 105   Temp (!) 97.2 F (36.2 C) (Temporal)   Ht 5\' 9"  (1.753 m)   Wt 178 lb 3.2 oz (80.8 kg)   SpO2 97%   BMI 26.32 kg/m   GEN: A/Ox3; pleasant , NAD, well nourished    HEENT:  [place]/AT,  NOSE-clear, THROAT-clear, no lesions, no postnasal drip or exudate noted.   NECK:  Supple w/ fair ROM; no JVD; normal carotid impulses w/o bruits; no thyromegaly or nodules palpated; no lymphadenopathy.    RESP  Clear  P & A; w/o, wheezes/ rales/ or rhonchi. no accessory muscle use, no dullness to percussion  CARD:  RRR, no m/r/g, no peripheral edema, pulses intact, no cyanosis or clubbing.  GI:   Soft & nt; nml bowel sounds; no organomegaly or masses detected.   Musco: Warm bil, no deformities or joint swelling noted.   Neuro: alert, no focal deficits noted.    Skin: Warm, no lesions or rashes    Lab Results:   BNP No results found for: BNP  ProBNP No results found for: PROBNP  Imaging: No results found.    PFT Results Latest Ref Rng & Units 05/30/2019  FVC-Pre L 3.68  FVC-Predicted Pre % 95  FVC-Post L 3.80  FVC-Predicted Post % 98  Pre FEV1/FVC % % 76  Post FEV1/FCV % % 75  FEV1-Pre L 2.78  FEV1-Predicted Pre % 91  FEV1-Post L 2.87  DLCO uncorrected ml/min/mmHg 24.35  DLCO UNC% % 90  DLCO corrected ml/min/mmHg 25.33  DLCO COR %Predicted % 93  DLVA Predicted % 103  TLC L 6.82  TLC % Predicted % 102  RV % Predicted % 152    No results found for: NITRICOXIDE      Assessment & Plan:   Recurrent pneumothorax Previous history of recurrent right-sided pneumothorax status post right VATS with bleb resection and pleurodesis.  Patient has done very well since October 2020.  Has had no known complications.  Over the last year remains active.  Exercises on a routine basis. We will check chest x-ray today.  Previous pulmonary function testing showed normal lung function. Patient is no longer smoking.  Patient is continue with activities as tolerated.    Plan   Patient Instructions  Activity as tolerated .  Chest xray today .  Follow up with pulmonary as needed.  Keep follow up with Primary MD .         Rexene Edison, NP 12/03/2020

## 2020-12-03 NOTE — Assessment & Plan Note (Signed)
Previous history of recurrent right-sided pneumothorax status post right VATS with bleb resection and pleurodesis.  Patient has done very well since October 2020.  Has had no known complications.  Over the last year remains active.  Exercises on a routine basis. We will check chest x-ray today.  Previous pulmonary function testing showed normal lung function. Patient is no longer smoking.  Patient is continue with activities as tolerated.    Plan  Patient Instructions  Activity as tolerated .  Chest xray today .  Follow up with pulmonary as needed.  Keep follow up with Primary MD .

## 2020-12-04 ENCOUNTER — Telehealth: Payer: Self-pay | Admitting: Adult Health

## 2020-12-04 NOTE — Progress Notes (Signed)
ATC x1.  LVM to return call regarding CXR results.

## 2020-12-04 NOTE — Telephone Encounter (Signed)
Call made to patient, confirmed DOB. Made aware or CXR results per TP. Voiced understanding.   Nothing further needed at this time.

## 2020-12-05 NOTE — Progress Notes (Signed)
Vivia Ewing LPN: Call made to patient, confirmed DOB. Made aware or CXR results per TP. Voiced understanding.   Nothing further needed at this time

## 2020-12-05 NOTE — Progress Notes (Signed)
ATC x2, left vm to return call regarding cxr.

## 2021-02-28 IMAGING — DX DG CHEST 2V
2 series · 2 of 2 positions shown · non-contrast
Comparison: 12/06/2019

CLINICAL DATA: History of spontaneous pneumothorax

EXAM:
CHEST - 2 VIEW

[chest pa]
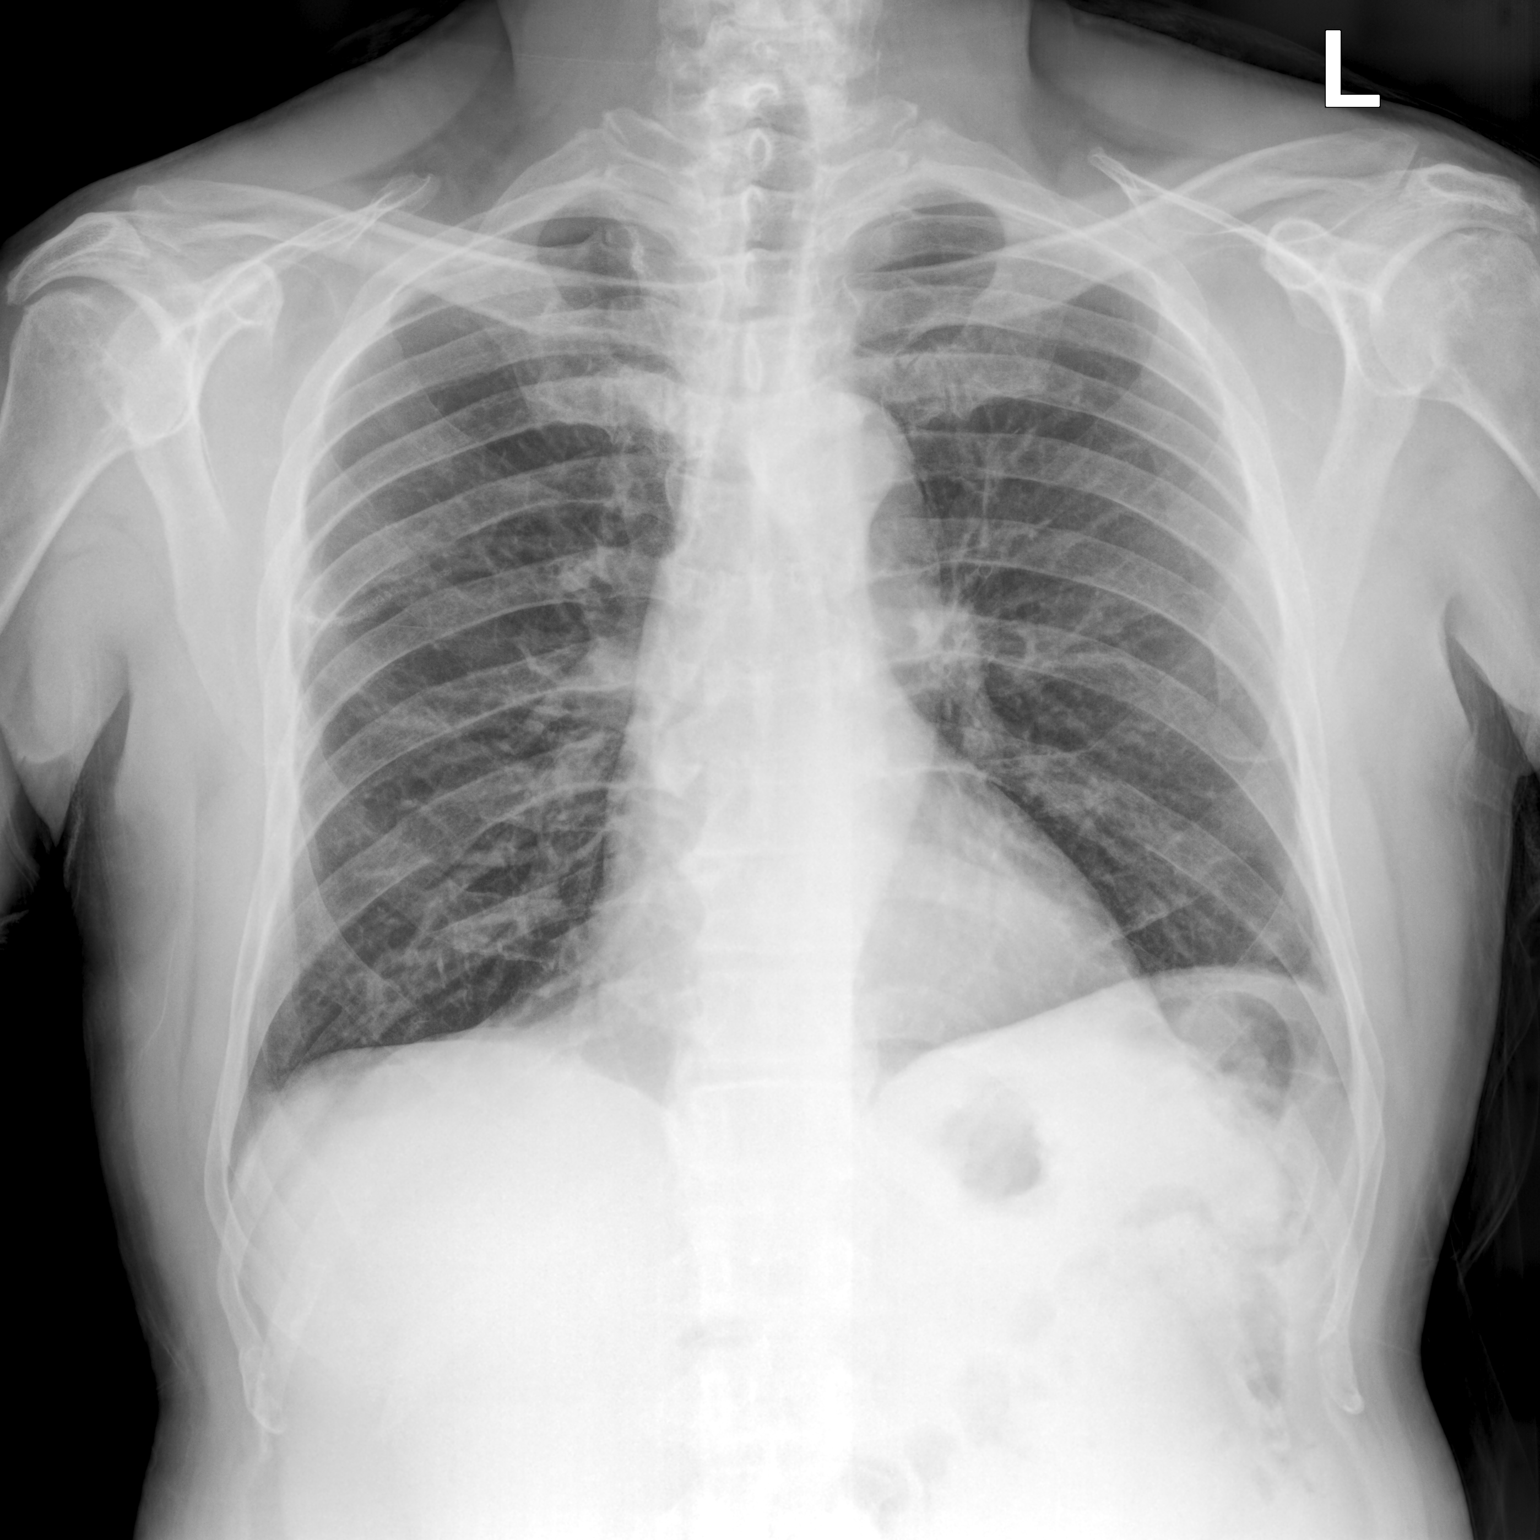

[chest lat]
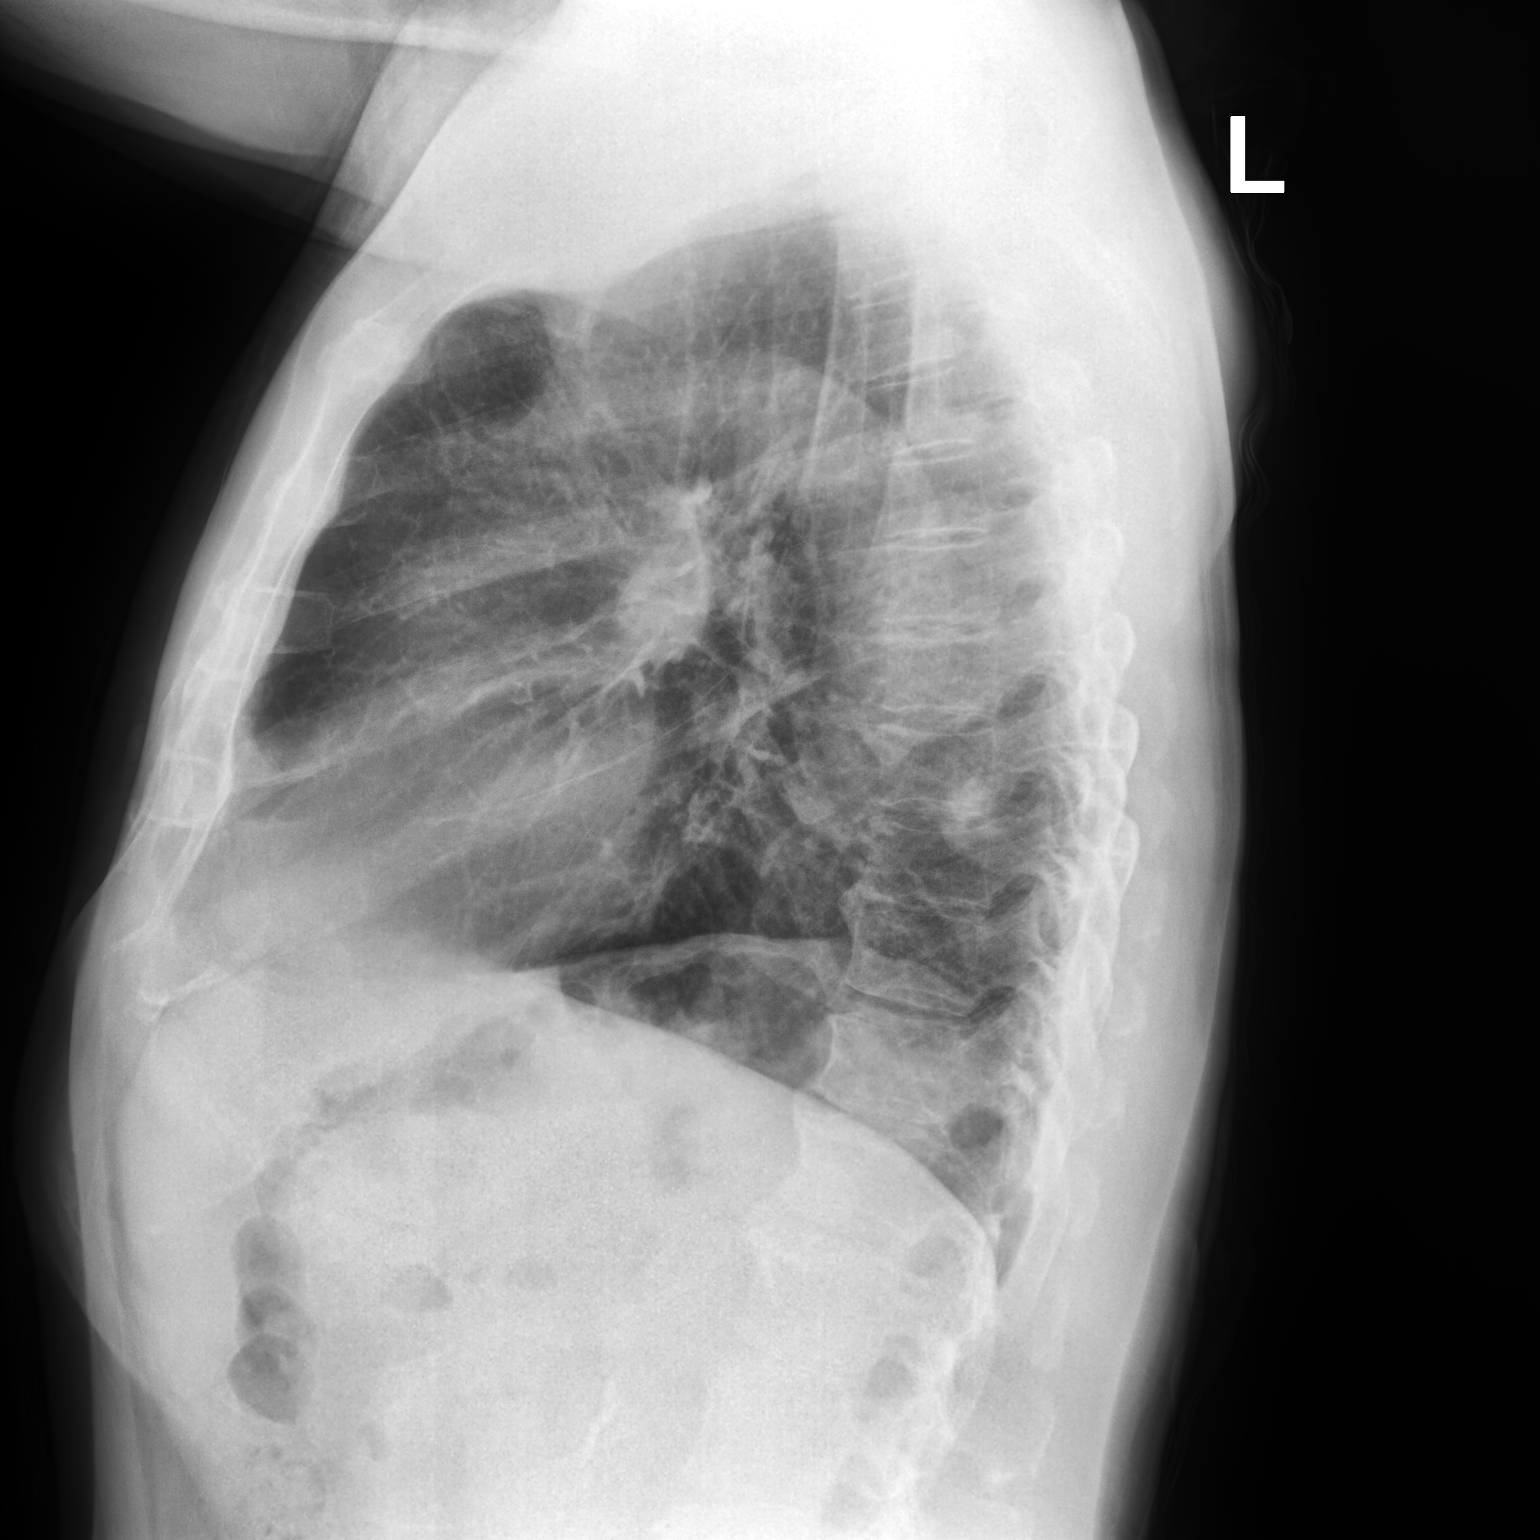

[2 of 2 positions shown; findings below may reference images not displayed]

FINDINGS: Frontal and lateral views of the chest demonstrates stable
postsurgical changes within the right upper lung. No acute airspace
disease, effusion, or pneumothorax. Cardiac silhouette is
unremarkable. No acute bony abnormalities.
IMPRESSION: 1. Stable chest, no acute process.

## 2023-07-08 ENCOUNTER — Ambulatory Visit (INDEPENDENT_AMBULATORY_CARE_PROVIDER_SITE_OTHER): Payer: 59 | Admitting: Podiatry

## 2023-07-08 DIAGNOSIS — M5416 Radiculopathy, lumbar region: Secondary | ICD-10-CM | POA: Diagnosis not present

## 2023-07-19 NOTE — Progress Notes (Signed)
   No chief complaint on file.   HPI: 62 y.o. male PMHx lumbar spine DDD presenting today for onset of bilateral foot pain.  Gradual onset over the last few years.  No alleviating factors.  Aggravated by activity.  Patient also experiences numbness and tingling extending down to his feet  Past Medical History:  Diagnosis Date   Arthritis    back   Hyperlipidemia    Spontaneous pneumothorax     Past Surgical History:  Procedure Laterality Date   CHEST TUBE INSERTION Right 08/16/2019   INTERCOSTAL NERVE BLOCK Right 08/18/2019   Procedure: Intercostal Nerve Block Thoracic;  Surgeon: Loreli Slot, MD;  Location: [facility] OR;  Service: Thoracic;  Laterality: Right;   PLEURADESIS  08/18/2019   Procedure: Pleuradesis;  Surgeon: Loreli Slot, MD;  Location: [facility] OR;  Service: Thoracic;;   STAPLING OF BLEBS Right 08/18/2019   Procedure: STAPLING OF BLEBS;  Surgeon: Loreli Slot, MD;  Location: [facility] OR;  Service: Thoracic;  Laterality: Right;   TOTAL HIP ARTHROPLASTY  03/2010   VIDEO ASSISTED THORACOSCOPY Right 08/18/2019   Procedure: VIDEO ASSISTED THORACOSCOPY;  Surgeon: Loreli Slot, MD;  Location: [facility] OR;  Service: Thoracic;  Laterality: Right;    No Known Allergies   Physical Exam: General: The patient is alert and oriented x3 in no acute distress.  Dermatology: Skin is warm, dry and supple bilateral lower extremities.   Vascular: Palpable pedal pulses bilaterally. Capillary refill within normal limits.  No appreciable edema.  No erythema.  Neurological: Grossly intact via light touch  Musculoskeletal Exam: No pedal deformities noted.  Generalized tenderness and achiness to the bilateral feet extending up into the legs   Assessment/Plan of Care: 1.  Lumbar radiculopathy 2.  DDD  -Patient evaluated -I do believe the patient is suffering from a lumbar radiculopathy.  Majority of symptoms are likely associated to this underlying etiology -Recommend  follow-up with orthospine -In the meantime recommend good supportive shoes and sneakers -Return to clinic as needed         Felecia Shelling, DPM [facility]  Dr. Felecia Shelling, DPM    [address] [place], [place] [address]                Office [phone]  Fax [phone]

## 2023-08-31 ENCOUNTER — Encounter: Payer: Self-pay | Admitting: Gastroenterology
